# Patient Record
Sex: Male | Born: 1950 | Race: White | Hispanic: No | Smoking: Former smoker
Health system: Southern US, Community
[De-identification: ages and names within clinical notes are randomized; demographics above are authoritative.]

## PROBLEM LIST (undated history)

## (undated) DIAGNOSIS — E119 Type 2 diabetes mellitus without complications: Secondary | ICD-10-CM

## (undated) DIAGNOSIS — K649 Unspecified hemorrhoids: Secondary | ICD-10-CM

## (undated) DIAGNOSIS — K219 Gastro-esophageal reflux disease without esophagitis: Secondary | ICD-10-CM

## (undated) DIAGNOSIS — A879 Viral meningitis, unspecified: Secondary | ICD-10-CM

## (undated) DIAGNOSIS — I341 Nonrheumatic mitral (valve) prolapse: Secondary | ICD-10-CM

## (undated) HISTORY — DX: Unspecified hemorrhoids: K64.9

## (undated) HISTORY — DX: Nonrheumatic mitral (valve) prolapse: I34.1

---

## 1998-01-17 ENCOUNTER — Ambulatory Visit (HOSPITAL_COMMUNITY): Admission: RE | Admit: 1998-01-17 | Discharge: 1998-01-17 | Payer: Self-pay | Admitting: Gastroenterology

## 1998-12-03 ENCOUNTER — Ambulatory Visit (HOSPITAL_BASED_OUTPATIENT_CLINIC_OR_DEPARTMENT_OTHER): Admission: RE | Admit: 1998-12-03 | Discharge: 1998-12-03 | Payer: Self-pay | Admitting: Surgery

## 1999-05-25 DIAGNOSIS — A879 Viral meningitis, unspecified: Secondary | ICD-10-CM

## 1999-05-25 HISTORY — DX: Viral meningitis, unspecified: A87.9

## 1999-05-25 HISTORY — PX: LIPOMA EXCISION: SHX5283

## 2000-03-23 ENCOUNTER — Emergency Department (HOSPITAL_COMMUNITY): Admission: EM | Admit: 2000-03-23 | Discharge: 2000-03-23 | Payer: Self-pay | Admitting: Emergency Medicine

## 2000-03-24 ENCOUNTER — Inpatient Hospital Stay (HOSPITAL_COMMUNITY): Admission: EM | Admit: 2000-03-24 | Discharge: 2000-03-28 | Payer: Self-pay | Admitting: Emergency Medicine

## 2000-03-24 ENCOUNTER — Encounter: Payer: Self-pay | Admitting: Emergency Medicine

## 2005-12-05 ENCOUNTER — Emergency Department (HOSPITAL_COMMUNITY): Admission: EM | Admit: 2005-12-05 | Discharge: 2005-12-06 | Payer: Self-pay | Admitting: Emergency Medicine

## 2013-08-09 ENCOUNTER — Encounter: Payer: Self-pay | Admitting: Interventional Cardiology

## 2013-09-25 ENCOUNTER — Ambulatory Visit: Payer: Self-pay | Admitting: Interventional Cardiology

## 2013-10-26 ENCOUNTER — Encounter: Payer: Self-pay | Admitting: Interventional Cardiology

## 2013-10-26 ENCOUNTER — Encounter: Payer: Self-pay | Admitting: Cardiology

## 2013-10-26 ENCOUNTER — Ambulatory Visit (INDEPENDENT_AMBULATORY_CARE_PROVIDER_SITE_OTHER): Payer: BC Managed Care – PPO | Admitting: Interventional Cardiology

## 2013-10-26 ENCOUNTER — Encounter (INDEPENDENT_AMBULATORY_CARE_PROVIDER_SITE_OTHER): Payer: Self-pay

## 2013-10-26 VITALS — BP 149/70 | Ht 71.0 in | Wt 189.0 lb

## 2013-10-26 DIAGNOSIS — IMO0001 Reserved for inherently not codable concepts without codable children: Secondary | ICD-10-CM

## 2013-10-26 DIAGNOSIS — I059 Rheumatic mitral valve disease, unspecified: Secondary | ICD-10-CM

## 2013-10-26 DIAGNOSIS — I341 Nonrheumatic mitral (valve) prolapse: Secondary | ICD-10-CM

## 2013-10-26 DIAGNOSIS — E1165 Type 2 diabetes mellitus with hyperglycemia: Secondary | ICD-10-CM | POA: Insufficient documentation

## 2013-10-26 NOTE — Progress Notes (Signed)
Patient ID: Matthew Lozano, male   DOB: May 09, 1951, 63 y.o.   MRN: [id number]    [address] [place], [place]  [address] Phone: [phone] Fax:  [phone]  Date:  10/26/2013   ID:  Matthew Lozano, DOB [birth year], MRN [id number]  PCP:  No primary provider on file.      History of Present Illness: Matthew Lozano is a 63 y.o. male who had MVP diagnosed 30 years ago. He had an echo. He has had a few cardiolites in the past. The last was 6-7 years ago. THey were normal.   No SHOB. No chest pain, even with walking up hills.   No sweating. Nausa better after stopping metformin. No lightheadedness or syncope. No other exercise other than golf. He wants to get into more exercise. Feet limits exercise.  Walks a lot at work.    Wt Readings from Last 3 Encounters:  10/26/13 189 lb (85.73 kg)     Past Medical History  Diagnosis Date  . Diabetes mellitus without complication     2008  . MVP (mitral valve prolapse)   . Hemorrhoids     Current Outpatient Prescriptions  Medication Sig Dispense Refill  . INVOKANA 100 MG TABS Take 100 mg by mouth daily.        No current facility-administered medications for this visit.    Allergies:    Allergies  Allergen Reactions  . Codeine     GI upset  . Phenergan [Promethazine Hcl]     Diarrhea, constipation     Social History:  The patient  reports that he has quit smoking. He does not have any smokeless tobacco history on file. He reports that he drinks alcohol. He reports that he does not use illicit drugs.   Family History:  The patient's family history includes CVA in his father; Other in his brother.   ROS:  Please see the history of present illness.  No nausea, vomiting.  No fevers, chills.  No focal weakness.  No dysuria. Foot oain.    All other systems reviewed and negative.   PHYSICAL EXAM: VS:  BP 149/70  Ht 5\' 11"  (1.803 m)  Wt 189 lb (85.73 kg)  BMI 26.37 kg/m2 Well nourished, well developed, in no acute  distress HEENT: normal Neck: no JVD, no carotid bruits Cardiac:  normal S1, S2; RRR;  Lungs:  clear to auscultation bilaterally, no wheezing, rhonchi or rales Abd: soft, nontender, no hepatomegaly Ext: no edema Skin: warm and dry Neuro:   no focal abnormalities noted  EKG: normal    ASSESSMENT AND PLAN:  1. Chest pain: Resolved. He had negative stress test last year. No further symptoms. Continue aggressive preventive therapy. 2. MVP: Not noted on most recent echo, 2014. 3. F/u prn.  Preventive Medicine  Adult topics discussed:  Diet: healthy diet, low calorie, low fat.  Exercise: 5 days a week, at least 30 minutes of aerobic exercise.      Signed, Fredric Mare, MD, [facility] 10/26/2013 2:01 PM

## 2013-10-26 NOTE — Patient Instructions (Signed)
Your physician recommends that you schedule a follow-up appointment as needed.   Call us if your BP is consistently above 130/80.

## 2014-08-29 DIAGNOSIS — M799 Soft tissue disorder, unspecified: Secondary | ICD-10-CM | POA: Insufficient documentation

## 2014-08-29 DIAGNOSIS — K625 Hemorrhage of anus and rectum: Secondary | ICD-10-CM | POA: Insufficient documentation

## 2015-01-15 ENCOUNTER — Emergency Department (HOSPITAL_BASED_OUTPATIENT_CLINIC_OR_DEPARTMENT_OTHER): Payer: BLUE CROSS/BLUE SHIELD

## 2015-01-15 ENCOUNTER — Emergency Department (HOSPITAL_BASED_OUTPATIENT_CLINIC_OR_DEPARTMENT_OTHER)
Admission: EM | Admit: 2015-01-15 | Discharge: 2015-01-15 | Disposition: A | Discharge status: Home / Self Care | Payer: BLUE CROSS/BLUE SHIELD | Attending: Emergency Medicine | Admitting: Emergency Medicine

## 2015-01-15 ENCOUNTER — Encounter (HOSPITAL_BASED_OUTPATIENT_CLINIC_OR_DEPARTMENT_OTHER): Payer: Self-pay | Admitting: Emergency Medicine

## 2015-01-15 DIAGNOSIS — Z8679 Personal history of other diseases of the circulatory system: Secondary | ICD-10-CM | POA: Insufficient documentation

## 2015-01-15 DIAGNOSIS — E119 Type 2 diabetes mellitus without complications: Secondary | ICD-10-CM | POA: Diagnosis not present

## 2015-01-15 DIAGNOSIS — R1084 Generalized abdominal pain: Secondary | ICD-10-CM | POA: Diagnosis present

## 2015-01-15 DIAGNOSIS — R1011 Right upper quadrant pain: Secondary | ICD-10-CM | POA: Diagnosis not present

## 2015-01-15 DIAGNOSIS — Z8719 Personal history of other diseases of the digestive system: Secondary | ICD-10-CM | POA: Insufficient documentation

## 2015-01-15 DIAGNOSIS — Z79899 Other long term (current) drug therapy: Secondary | ICD-10-CM | POA: Diagnosis not present

## 2015-01-15 DIAGNOSIS — Z87891 Personal history of nicotine dependence: Secondary | ICD-10-CM | POA: Insufficient documentation

## 2015-01-15 DIAGNOSIS — R110 Nausea: Secondary | ICD-10-CM | POA: Diagnosis not present

## 2015-01-15 LAB — CBC WITH DIFFERENTIAL/PLATELET
Basophils Absolute: 0.1 10*3/uL (ref 0.0–0.1)
Basophils Relative: 1 % (ref 0–1)
Eosinophils Absolute: 0.1 10*3/uL (ref 0.0–0.7)
Eosinophils Relative: 1 % (ref 0–5)
HCT: 43.7 % (ref 39.0–52.0)
Hemoglobin: 15.9 g/dL (ref 13.0–17.0)
Lymphocytes Relative: 35 % (ref 12–46)
Lymphs Abs: 2.7 10*3/uL (ref 0.7–4.0)
MCH: 32.4 pg (ref 26.0–34.0)
MCHC: 36.4 g/dL — ABNORMAL HIGH (ref 30.0–36.0)
MCV: 89.2 fL (ref 78.0–100.0)
Monocytes Absolute: 0.7 10*3/uL (ref 0.1–1.0)
Monocytes Relative: 9 % (ref 3–12)
Neutro Abs: 4.2 10*3/uL (ref 1.7–7.7)
Neutrophils Relative %: 54 % (ref 43–77)
Platelets: 188 10*3/uL (ref 150–400)
RBC: 4.9 MIL/uL (ref 4.22–5.81)
RDW: 12.1 % (ref 11.5–15.5)
WBC: 7.8 10*3/uL (ref 4.0–10.5)

## 2015-01-15 LAB — COMPREHENSIVE METABOLIC PANEL
ALT: 18 U/L (ref 17–63)
AST: 19 U/L (ref 15–41)
Albumin: 4.4 g/dL (ref 3.5–5.0)
Alkaline Phosphatase: 75 U/L (ref 38–126)
Anion gap: 11 (ref 5–15)
BUN: 17 mg/dL (ref 6–20)
CO2: 25 mmol/L (ref 22–32)
Calcium: 9.3 mg/dL (ref 8.9–10.3)
Chloride: 103 mmol/L (ref 101–111)
Creatinine, Ser: 0.83 mg/dL (ref 0.61–1.24)
GFR calc Af Amer: >60 mL/min (ref >60)
GFR calc non Af Amer: >60 mL/min (ref >60)
Glucose, Bld: 183 mg/dL — ABNORMAL HIGH (ref 65–99)
Potassium: 3.6 mmol/L (ref 3.5–5.1)
Sodium: 139 mmol/L (ref 135–145)
Total Bilirubin: 0.9 mg/dL (ref 0.3–1.2)
Total Protein: 7.1 g/dL (ref 6.5–8.1)

## 2015-01-15 LAB — LIPASE, BLOOD: Lipase: 39 U/L (ref 22–51)

## 2015-01-15 MED ORDER — IOHEXOL 300 MG/ML  SOLN
100.0000 mL | Freq: Once | INTRAMUSCULAR | Status: AC | PRN
  Administered 2015-01-15: 100 mL via INTRAVENOUS

## 2015-01-15 MED ORDER — IOHEXOL 300 MG/ML  SOLN
25.0000 mL | Freq: Once | INTRAMUSCULAR | Status: AC | PRN
  Administered 2015-01-15: 25 mL via ORAL

## 2015-01-15 NOTE — ED Notes (Signed)
Patient transported to X-ray 

## 2015-01-15 NOTE — Discharge Instructions (Signed)

## 2015-01-15 NOTE — ED Provider Notes (Signed)
CSN: [id number]     Arrival date & time 01/15/15  1715 History  This chart was scribed for Linwood Dibbles, MD by Octavia Heir, ED Scribe. This patient was seen in room MH01/MH01 and the patient's care was started at 8:31 PM.    Chief Complaint  Patient presents with  . Abdominal Pain  . Referral      The history is provided by the patient. No language interpreter was used.   HPI Comments: Matthew Lozano is a 64 y.o. male who has hx of DM and MVP presents to the Emergency Department complaining of intermittent, gradual worsening abdominal pain onset one month ago. Pt states having some generalized discomfort and nausea in the morning. He states he hit a bag of dirt and injured his abdomen shortly after. Pt also states being seen by his PCP on Monday for some blood work and was told to come to the ED due to his pancreatitis labs being elevated. Pt denies fevers, vomiting, and loss of appetite. Pt states he is a daily drinker.   Past Medical History  Diagnosis Date  . Diabetes mellitus without complication     2008  . MVP (mitral valve prolapse)   . Hemorrhoids    Past Surgical History  Procedure Laterality Date  . Lipoma excision      OF BACK    Family History  Problem Relation Age of Onset  . CVA Father   . Other Brother     AIDS   Social History  Substance Use Topics  . Smoking status: Former Smoker -- 10 years  . Smokeless tobacco: None  . Alcohol Use: Yes    Review of Systems  Constitutional: Negative for fever and appetite change.  Gastrointestinal: Positive for nausea and abdominal pain. Negative for vomiting.  All other systems reviewed and are negative.     Allergies  Codeine and Phenergan  Home Medications   Prior to Admission medications   Medication Sig Start Date End Date Taking? Authorizing Provider  INVOKANA 100 MG TABS Take 100 mg by mouth daily.  10/24/13   Historical Provider, MD   Triage vitals: BP 138/74 mmHg  Pulse 74  Temp(Src) 98.3 F (36.8 C)  (Oral)  Resp 16  Ht 5\' 11"  (1.803 m)  Wt 181 lb (82.101 kg)  BMI 25.26 kg/m2  SpO2 99% Physical Exam  Constitutional: He appears well-developed and well-nourished. No distress.  HENT:  Head: Normocephalic and atraumatic.  Right Ear: External ear normal.  Left Ear: External ear normal.  Eyes: Conjunctivae are normal. Right eye exhibits no discharge. Left eye exhibits no discharge. No scleral icterus.  Neck: Neck supple. No tracheal deviation present.  Cardiovascular: Normal rate, regular rhythm and intact distal pulses.   Pulmonary/Chest: Effort normal and breath sounds normal. No stridor. No respiratory distress. He has no wheezes. He has no rales.  Abdominal: Soft. Bowel sounds are normal. He exhibits no distension. There is no tenderness. There is no rebound and no guarding.  Musculoskeletal: He exhibits no edema or tenderness.  Neurological: He is alert. He has normal strength. No cranial nerve deficit (no facial droop, extraocular movements intact, no slurred speech) or sensory deficit. He exhibits normal muscle tone. He displays no seizure activity. Coordination normal.  Skin: Skin is warm and dry. No rash noted.  Psychiatric: He has a normal mood and affect.  Nursing note and vitals reviewed.   ED Course  Procedures  DIAGNOSTIC STUDIES: Oxygen Saturation is 99% on RA, normal by my  interpretation.  COORDINATION OF CARE:  8:35 PM Discussed treatment plan which includes CT scan of abdomen, recheck lab work with pt at bedside and pt agreed to plan.  Labs Review Labs Reviewed  CBC WITH DIFFERENTIAL/PLATELET - Abnormal; Notable for the following:    MCHC 36.4 (*)    All other components within normal limits  COMPREHENSIVE METABOLIC PANEL - Abnormal; Notable for the following:    Glucose, Bld 183 (*)    All other components within normal limits  LIPASE, BLOOD    Imaging Review Ct Abdomen Pelvis W Contrast  01/15/2015   CLINICAL DATA:  Right abdominal pain for 1 month.   EXAM: CT ABDOMEN AND PELVIS WITH CONTRAST  TECHNIQUE: Multidetector CT imaging of the abdomen and pelvis was performed using the standard protocol following bolus administration of intravenous contrast.  CONTRAST:  25mL OMNIPAQUE IOHEXOL 300 MG/ML SOLN, OMNIPAQUE IOHEXOL 300 MG/ML SOLN  COMPARISON:  None.  FINDINGS: Lower chest: Clear lung bases. Normal heart size. Mild coronary artery atherosclerosis.  Hepatobiliary: Low attenuation of the liver as can be seen with hepatic steatosis. Normal gallbladder. No focal hepatic mass. No intrahepatic or extrahepatic biliary ductal dilatation.  Pancreas: Normal.  Spleen: Normal.  Adrenals/Urinary Tract: Normal adrenal glands. Normal kidneys. Normal bladder.  Stomach/Bowel: No bowel wall thickening. No bowel dilatation. No pneumatosis, pneumoperitoneum or portal venous gas. Normal caliber appendix in the right lower quadrant without periappendiceal inflammatory changes. Moderate amount of stool in the ascending colon. No abdominal or pelvic free fluid. Diverticulosis of the sigmoid colon without evidence of diverticulitis.  Vascular/Lymphatic: Normal caliber abdominal aorta with atherosclerosis.  Other: No fluid collection or hematoma.  Musculoskeletal: No acute osseous abnormality. No lytic or sclerotic osseous lesion. Degenerative disc disease with disc height loss at L5-S1 to with a broad-based disc osteophyte complex and bilateral facet arthropathy.  IMPRESSION: 1. No CT evidence of acute pancreatitis. 2. Normal appendix. 3. Hepatic steatosis.   Electronically Signed   By: Elige Ko   On: 01/15/2015 22:17   I have personally reviewed and evaluated these image reports and lab results as part of my medical decision-making.   MDM   Final diagnoses:  Right upper quadrant pain    No etiology for his abdominal pain based on the labs and CT scan.  Will dc home.  Follow up with his PCP for further evaluation.  I personally performed the services described in  this documentation, which was scribed in my presence.  The recorded information has been reviewed and is accurate.   Linwood Dibbles, MD 01/15/15 [phone]

## 2015-01-15 NOTE — ED Notes (Signed)
Pt seen by PCP on Monday for blood work.  Was told that labs were elevated and that he needed to go to ED.  Pt having abdominal discomfort.  Some morning nausea.  And some generalized discomfort.

## 2015-03-05 ENCOUNTER — Other Ambulatory Visit: Payer: Self-pay | Admitting: Surgery

## 2015-03-05 NOTE — H&P (Signed)
Matthew Lozano 03/05/2015 9:28 AM Location: [facility] Patient #: [id number] DOB: 11/25/1950 Divorced / Language: [place] / Race: White Male History of Present Illness Ardeth Sportsman(Dejuan Elman C. Bryann Gentz MD; 03/05/2015 9:57 AM) Patient words: reck.  The patient is a 64 year old male who presents with skin lesions. Patient sent by his primary care physician, Dr. Catha GosselinKevin Little, for concern of mass on back. Pleasant active male. Golfs regularly. History of lipoma removed off his neck decades ago without incident. No other lumps or bumps. His new girlfriend noticed a nodule just inside his LEFT shoulder blade in his upper back. Was concerned. He mentioned to his primary care physician. Surgical consultation requested. See most likely consistent with a small lipoma. I offered removal when I saw him in April 2016. I never heard from him again. He tells me that his stayed the same and does not bother him so he did not want it removed. However he points to a new nodule in his RIGHT upper abdomen. It is sensitive and bothersome. Not particularly larger. He thinks it's been there for several months. He thought he had felt something in the RIGHT lower abdomen but cannot feel it today. No history of fall or trauma. Patient denies any pain. No fevers or chills. His had mild intentional weight loss but not severe. No night sweats. No history of lymphoma or leukemia. No history of pain or discomfort. No history of fall or trauma. It has not drained. He does not think it is gotten larger. He can walk 5 miles without difficulty. Problem List/Past Medical Ardeth Sportsman(Trevonn Hallum C Yuriana Gaal, MD; 03/05/2015 9:56 AM) MASS OF SUBCUTANEOUS TISSUE OF BACK (R22.2)  Other Problems Ardeth Sportsman(Kamaya Keckler C Jakaya Jacobowitz, MD; 03/05/2015 9:56 AM) Diabetes Mellitus Heart murmur Hemorrhoids  Past Surgical History Ardeth Sportsman(Theo Krumholz C Jermika Olden, MD; 03/05/2015 9:56 AM) Colon Polyp Removal - Colonoscopy Oral Surgery  Diagnostic Studies History Ardeth Sportsman(Tiea Manninen C Maralyn Witherell,  MD; 03/05/2015 9:56 AM) Colonoscopy 5-10 years ago  Allergies Lamar Laundry(Sonya Bynum, CMA; 03/05/2015 9:29 AM) Codeine Phosphate *ANALGESICS - OPIOID* Phenergan *ANTIHISTAMINES*  Medication History (Sonya Bynum, CMA; 03/05/2015 9:29 AM) Invokana (300MG  Tablet, Oral) Active. Medications Reconciled  Social History Ardeth Sportsman(Skiler Olden C Yonah Tangeman, MD; 03/05/2015 9:56 AM) Alcohol use Moderate alcohol use. No caffeine use No drug use Tobacco use Former smoker.  Family History Ardeth Sportsman(Tanise Russman C Yonis Carreon, MD; 03/05/2015 9:56 AM) Alcohol Abuse Mother. Colon Polyps Mother.     Review of Systems Ardeth Sportsman(Daryan Cagley C Judy Pollman, MD; 03/05/2015 9:56 00) General Present- Weight Loss. Not Present- Appetite Loss, Chills, Fatigue, Fever, Night Sweats and Weight Gain. Skin Not Present- Change in Wart/Mole, Dryness, Hives, Jaundice, New Lesions, Non-Healing Wounds, Rash and Ulcer. HEENT Not Present- Earache, Hearing Loss, Hoarseness, Nose Bleed, Oral Ulcers, Ringing in the Ears, Seasonal Allergies, Sinus Pain, Sore Throat, Visual Disturbances, Wears glasses/contact lenses and Yellow Eyes. Respiratory Not Present- Bloody sputum, Chronic Cough, Difficulty Breathing, Snoring and Wheezing. Breast Not Present- Breast Mass, Breast Pain, Nipple Discharge and Skin Changes. Cardiovascular Not Present- Chest Pain, Difficulty Breathing Lying Down, Leg Cramps, Palpitations, Rapid Heart Rate, Shortness of Breath and Swelling of Extremities. Gastrointestinal Present- Bloody Stool and Hemorrhoids. Not Present- Abdominal Pain, Bloating, Change in Bowel Habits, Chronic diarrhea, Constipation, Difficulty Swallowing, Excessive gas, Gets full quickly at meals, Indigestion, Nausea, Rectal Pain and Vomiting. Male Genitourinary Not Present- Blood in Urine, Change in Urinary Stream, Frequency, Impotence, Nocturia, Painful Urination, Urgency and Urine Leakage. Musculoskeletal Not Present- Back Pain, Joint Pain, Joint Stiffness, Muscle Pain, Muscle Weakness and  Swelling of Extremities. Neurological Not Present-  Decreased Memory, Fainting, Headaches, Numbness, Seizures, Tingling, Tremor, Trouble walking and Weakness. Psychiatric Not Present- Anxiety, Bipolar, Change in Sleep Pattern, Depression, Fearful and Frequent crying. Endocrine Not Present- Cold Intolerance, Excessive Hunger, Hair Changes, Heat Intolerance and New Diabetes. Hematology Not Present- Easy Bruising, Excessive bleeding, Gland problems, HIV and Persistent Infections.  Vitals (Sonya Bynum CMA; 03/05/2015 9:28 AM) 03/05/2015 9:28 AM Weight: 186 lb Height: 71in Body Surface Area: 2.06 m Body Mass Index: 25.94 kg/m Temp.: 9F(Temporal)  Pulse: 73 (Regular)  BP: 126/74 (Sitting, Left Arm, Standard)     Physical Exam Ardeth Sportsman MD; 03/05/2015 9:58 AM)  General Mental Status-Alert. General Appearance-Not in acute distress, Not Sickly. Orientation-Oriented X3. Hydration-Well hydrated. Voice-Normal.  Integumentary Global Assessment Normal Exam - Axillae: non-tender, no inflammation or ulceration, no drainage. and Distribution of scalp and body hair is normal. General Characteristics Temperature - normal warmth is noted.  Head and Neck Head-normocephalic, atraumatic with no lesions or palpable masses. Face Global Assessment - atraumatic, no absence of expression. Neck Global Assessment - no abnormal movements, no bruit auscultated on the right, no bruit auscultated on the left, no decreased range of motion, non-tender. Trachea-midline. Thyroid Gland Characteristics - non-tender.  Eye Eyeball - Left-Extraocular movements intact, No Nystagmus. Eyeball - Right-Extraocular movements intact, No Nystagmus. Cornea - Left-No Hazy. Cornea - Right-No Hazy. Sclera/Conjunctiva - Left-No scleral icterus, No Discharge. Sclera/Conjunctiva - Right-No scleral icterus, No Discharge. Pupil - Left-Direct reaction to light normal. Pupil -  Right-Direct reaction to light normal.  ENMT Ears Pinna - Left - no drainage observed, no generalized tenderness observed. Right - no drainage observed, no generalized tenderness observed. Nose and Sinuses Nose - no destructive lesion observed. Nares - Left - quiet respiration. Right - quiet respiration. Mouth and Throat Lips - Upper Lip - no fissures observed, no pallor noted. Lower Lip - no fissures observed, no pallor noted. Nasopharynx - no discharge present. Oral Cavity/Oropharynx - Tongue - no dryness observed. Oral Mucosa - no cyanosis observed. Hypopharynx - no evidence of airway distress observed.  Chest and Lung Exam Inspection Movements - Normal and Symmetrical. Accessory muscles - No use of accessory muscles in breathing. Palpation Normal exam - Non-tender. Auscultation Breath sounds - Normal and Clear.  Cardiovascular Auscultation Rhythm - Regular. Murmurs & Other Heart Sounds - Normal exam - No Murmurs and No Systolic Clicks.  Abdomen Inspection Normal Exam - No Visible peristalsis and No Abnormal pulsations. Umbilicus - No Bleeding, No Urine drainage. Palpation/Percussion Normal exam - Soft, Non Tender, No Rebound tenderness, No Rigidity (guarding) and No Cutaneous hyperesthesia. Note: Soft and flat. 1 cm deep ellipsoid subcutaneous nodule a few centimeters inferior to the subcostal ridge in the midclavicular line. Fluctuance. No cellulitis. Mostly fixed.   Male Genitourinary Sexual Maturity Tanner 5 - Adult hair pattern and Adult penile size and shape.  Peripheral Vascular Upper Extremity Inspection - Left - No Cyanotic nailbeds, Not Ischemic. Right - No Cyanotic nailbeds, Not Ischemic.  Neurologic Neurologic evaluation reveals -normal attention span and ability to concentrate, able to name objects and repeat phrases. Appropriate fund of knowledge , normal sensation and normal coordination. Mental Status Affect - not angry, not paranoid. Cranial  Nerves-Normal Bilaterally. Gait-Normal.  Neuropsychiatric Mental status exam performed with findings of-able to articulate well with normal speech/language, rate, volume and coherence, thought content normal with ability to perform basic computations and apply abstract reasoning and no evidence of hallucinations, delusions, obsessions or homicidal/suicidal ideation.  Musculoskeletal Global Assessment Spine, Ribs and Pelvis -  no instability, subluxation or laxity. Right Upper Extremity - no instability, subluxation or laxity. Note: Smooth but firm and somewhat mobile deep 2cm subcutaneous nodule on back. Mid thoracic on LEFT side, just medial to the lower scapula. Not quite at the level of the tip. Does not hurt. No fluctuance. No drainage. A few centimeters deep to the skin. No change in size from evaluation April 2016   Lymphatic Head & Neck General Head & Neck Lymphatics: Bilateral - Description - No Localized lymphadenopathy. Axillary General Axillary Region: Bilateral - Description - No Localized lymphadenopathy. Femoral & Inguinal Generalized Femoral & Inguinal Lymphatics: Left: Right - Description - No Localized lymphadenopathy. Description - No Localized lymphadenopathy.    Assessment & Plan Ardeth Sportsman MD; 03/05/2015 10:01 AM)  MASS OF SUBCUTANEOUS TISSUE OF BACK (R22.2) Impression: 2 cm ellipsoid mass in the deep subcutaneous tissues just inside his LEFT scapula. Mostly mobile. Most likely benign such as an enlarged lymph node or lipoma. Seems to deep to be a cyst.  Most aggressive option is removal. Given its deeper location, would recommend outpatient surgery with at least sedation if not anesthesia.  Another option is observation. He would still just like to watch it for now. If it is becoming symptomatic or gets larger, he would like to have it removed. If it does not change in size and does not bother him, he would like to hold off on any removal. I think  that is reasonable.  Current Plans Follow up if no improvement or if symptoms worsen Discussed regular exercise with patient. Pt Education - [facility] Free Text Education/Instructions ABDOMINAL WALL MASS OF RIGHT UPPER QUADRANT (R19.01) Impression: Smaller but more fixed and sensitive mass of subcutaneous tissues of abdominal wall. Rather deep. Most likely a lipoma or a lymph node. While he has no concerns or desire to remove the mass on his LEFT back, he is more concerned about the smaller more sensitive mass. He wishes to have it removed.  Given his deeper location most likely fixed to the anterior fascia, and would like to do under sedation with outpatient surgery. He agrees. He did not seen interested in May removing the other mass while I was there at this time. However, it would not surprise me if he changes his mind on the day of surgery. We will see.  Current Plans You are being scheduled for surgery - Our schedulers will call you.  You should hear from our office's scheduling department within 5 working days about the location, date, and time of surgery. We try to make accommodations for patient's preferences in scheduling surgery, but sometimes the OR schedule or the surgeon's schedule prevents [place] from making those accommodations.  If you have not heard from our office [phone]) in 5 working days, call the office and ask for your surgeon's nurse.  If you have other questions about your diagnosis, plan, or surgery, call the office and ask for your surgeon's nurse. The pathophysiology of skin & subcutaneous masses was discussed. Natural history risks without surgery were discussed. I recommended surgery to remove the mass. I explained the technique of removal with use of local anesthesia & possible need for more aggressive sedation/anesthesia for patient comfort.  Risks such as bleeding, infection, wound breakdown, heart attack, death, and other risks were discussed. I noted a good  likelihood this will help address the problem. Possibility that this will not correct all symptoms was explained. Possibility of regrowth/recurrence of the mass was discussed. We will work to  minimize complications. Questions were answered. The patient expresses understanding & wishes to proceed with surgery. Pt Education - [facility] General Post-op HCI  Ardeth Sportsman, M.D., F.A.C.S. Gastrointestinal and Minimally Invasive Surgery [facility] [address], [address] [place], [place] [address] [phone] Main / Paging

## 2015-10-14 DIAGNOSIS — H2513 Age-related nuclear cataract, bilateral: Secondary | ICD-10-CM | POA: Diagnosis not present

## 2015-10-14 DIAGNOSIS — H11152 Pinguecula, left eye: Secondary | ICD-10-CM | POA: Diagnosis not present

## 2015-10-14 DIAGNOSIS — H539 Unspecified visual disturbance: Secondary | ICD-10-CM | POA: Diagnosis not present

## 2015-10-14 DIAGNOSIS — E119 Type 2 diabetes mellitus without complications: Secondary | ICD-10-CM | POA: Diagnosis not present

## 2015-10-14 DIAGNOSIS — H11001 Unspecified pterygium of right eye: Secondary | ICD-10-CM | POA: Diagnosis not present

## 2015-10-14 DIAGNOSIS — G43009 Migraine without aura, not intractable, without status migrainosus: Secondary | ICD-10-CM | POA: Diagnosis not present

## 2015-10-15 DIAGNOSIS — E119 Type 2 diabetes mellitus without complications: Secondary | ICD-10-CM | POA: Diagnosis not present

## 2015-11-10 DIAGNOSIS — G43009 Migraine without aura, not intractable, without status migrainosus: Secondary | ICD-10-CM | POA: Diagnosis not present

## 2015-11-10 DIAGNOSIS — H11152 Pinguecula, left eye: Secondary | ICD-10-CM | POA: Diagnosis not present

## 2015-11-10 DIAGNOSIS — H2513 Age-related nuclear cataract, bilateral: Secondary | ICD-10-CM | POA: Diagnosis not present

## 2015-11-10 DIAGNOSIS — H5340 Unspecified visual field defects: Secondary | ICD-10-CM | POA: Diagnosis not present

## 2015-11-10 DIAGNOSIS — E119 Type 2 diabetes mellitus without complications: Secondary | ICD-10-CM | POA: Diagnosis not present

## 2015-11-10 DIAGNOSIS — H11001 Unspecified pterygium of right eye: Secondary | ICD-10-CM | POA: Diagnosis not present

## 2015-11-15 DIAGNOSIS — H5340 Unspecified visual field defects: Secondary | ICD-10-CM | POA: Diagnosis not present

## 2015-12-10 DIAGNOSIS — E119 Type 2 diabetes mellitus without complications: Secondary | ICD-10-CM | POA: Diagnosis not present

## 2016-03-17 DIAGNOSIS — M25511 Pain in right shoulder: Secondary | ICD-10-CM | POA: Diagnosis not present

## 2016-03-17 DIAGNOSIS — Z7984 Long term (current) use of oral hypoglycemic drugs: Secondary | ICD-10-CM | POA: Diagnosis not present

## 2016-03-17 DIAGNOSIS — Z125 Encounter for screening for malignant neoplasm of prostate: Secondary | ICD-10-CM | POA: Diagnosis not present

## 2016-03-17 DIAGNOSIS — Z79899 Other long term (current) drug therapy: Secondary | ICD-10-CM | POA: Diagnosis not present

## 2016-03-17 DIAGNOSIS — E119 Type 2 diabetes mellitus without complications: Secondary | ICD-10-CM | POA: Diagnosis not present

## 2016-03-18 DIAGNOSIS — E119 Type 2 diabetes mellitus without complications: Secondary | ICD-10-CM | POA: Diagnosis not present

## 2016-03-24 DIAGNOSIS — M25511 Pain in right shoulder: Secondary | ICD-10-CM | POA: Diagnosis not present

## 2016-03-24 DIAGNOSIS — M7541 Impingement syndrome of right shoulder: Secondary | ICD-10-CM | POA: Diagnosis not present

## 2016-03-25 DIAGNOSIS — H53432 Sector or arcuate defects, left eye: Secondary | ICD-10-CM | POA: Diagnosis not present

## 2016-04-26 DIAGNOSIS — H53432 Sector or arcuate defects, left eye: Secondary | ICD-10-CM | POA: Diagnosis not present

## 2016-07-01 ENCOUNTER — Emergency Department (HOSPITAL_COMMUNITY): Payer: Medicare Other

## 2016-07-01 ENCOUNTER — Encounter (HOSPITAL_COMMUNITY): Payer: Self-pay

## 2016-07-01 ENCOUNTER — Emergency Department (HOSPITAL_COMMUNITY)
Admission: EM | Admit: 2016-07-01 | Discharge: 2016-07-01 | Disposition: A | Discharge status: Home / Self Care | Payer: Medicare Other | Attending: Emergency Medicine | Admitting: Emergency Medicine

## 2016-07-01 DIAGNOSIS — E860 Dehydration: Secondary | ICD-10-CM | POA: Diagnosis not present

## 2016-07-01 DIAGNOSIS — J101 Influenza due to other identified influenza virus with other respiratory manifestations: Secondary | ICD-10-CM

## 2016-07-01 DIAGNOSIS — R1110 Vomiting, unspecified: Secondary | ICD-10-CM | POA: Diagnosis not present

## 2016-07-01 DIAGNOSIS — Z87891 Personal history of nicotine dependence: Secondary | ICD-10-CM | POA: Diagnosis not present

## 2016-07-01 DIAGNOSIS — E119 Type 2 diabetes mellitus without complications: Secondary | ICD-10-CM | POA: Diagnosis not present

## 2016-07-01 DIAGNOSIS — J09X9 Influenza due to identified novel influenza A virus with other manifestations: Secondary | ICD-10-CM | POA: Insufficient documentation

## 2016-07-01 DIAGNOSIS — R938 Abnormal findings on diagnostic imaging of other specified body structures: Secondary | ICD-10-CM | POA: Insufficient documentation

## 2016-07-01 DIAGNOSIS — R112 Nausea with vomiting, unspecified: Secondary | ICD-10-CM | POA: Diagnosis present

## 2016-07-01 DIAGNOSIS — J09X2 Influenza due to identified novel influenza A virus with other respiratory manifestations: Secondary | ICD-10-CM | POA: Diagnosis not present

## 2016-07-01 LAB — COMPREHENSIVE METABOLIC PANEL
ALT: 25 U/L (ref 17–63)
AST: 27 U/L (ref 15–41)
Albumin: 4.2 g/dL (ref 3.5–5.0)
Alkaline Phosphatase: 56 U/L (ref 38–126)
Anion gap: 19 — ABNORMAL HIGH (ref 5–15)
BUN: 23 mg/dL — ABNORMAL HIGH (ref 6–20)
CO2: 14 mmol/L — ABNORMAL LOW (ref 22–32)
Calcium: 8.6 mg/dL — ABNORMAL LOW (ref 8.9–10.3)
Chloride: 100 mmol/L — ABNORMAL LOW (ref 101–111)
Creatinine, Ser: 1.03 mg/dL (ref 0.61–1.24)
GFR calc Af Amer: >60 mL/min (ref >60)
GFR calc non Af Amer: >60 mL/min (ref >60)
Glucose, Bld: 210 mg/dL — ABNORMAL HIGH (ref 65–99)
Potassium: 4.1 mmol/L (ref 3.5–5.1)
Sodium: 133 mmol/L — ABNORMAL LOW (ref 135–145)
Total Bilirubin: 2 mg/dL — ABNORMAL HIGH (ref 0.3–1.2)
Total Protein: 7.2 g/dL (ref 6.5–8.1)

## 2016-07-01 LAB — BASIC METABOLIC PANEL
Anion gap: 11 (ref 5–15)
BUN: 18 mg/dL (ref 6–20)
CO2: 16 mmol/L — ABNORMAL LOW (ref 22–32)
Calcium: 7.7 mg/dL — ABNORMAL LOW (ref 8.9–10.3)
Chloride: 107 mmol/L (ref 101–111)
Creatinine, Ser: 0.85 mg/dL (ref 0.61–1.24)
GFR calc Af Amer: >60 mL/min (ref >60)
GFR calc non Af Amer: >60 mL/min (ref >60)
Glucose, Bld: 175 mg/dL — ABNORMAL HIGH (ref 65–99)
Potassium: 3.8 mmol/L (ref 3.5–5.1)
Sodium: 134 mmol/L — ABNORMAL LOW (ref 135–145)

## 2016-07-01 LAB — URINALYSIS, ROUTINE W REFLEX MICROSCOPIC
Bacteria, UA: NONE SEEN
Bilirubin Urine: NEGATIVE
Glucose, UA: >=500 mg/dL — AB
Hgb urine dipstick: NEGATIVE
Ketones, ur: 80 mg/dL — AB
Leukocytes, UA: NEGATIVE
Nitrite: NEGATIVE
Protein, ur: 30 mg/dL — AB
Specific Gravity, Urine: 1.029 (ref 1.005–1.030)
pH: 5 (ref 5.0–8.0)

## 2016-07-01 LAB — CBC WITH DIFFERENTIAL/PLATELET
Basophils Absolute: 0 10*3/uL (ref 0.0–0.1)
Basophils Relative: 0 %
Eosinophils Absolute: 0 10*3/uL (ref 0.0–0.7)
Eosinophils Relative: 0 %
HCT: 44.7 % (ref 39.0–52.0)
Hemoglobin: 17 g/dL (ref 13.0–17.0)
Lymphocytes Relative: 13 %
Lymphs Abs: 0.8 10*3/uL (ref 0.7–4.0)
MCH: 33 pg (ref 26.0–34.0)
MCHC: 38 g/dL — ABNORMAL HIGH (ref 30.0–36.0)
MCV: 86.8 fL (ref 78.0–100.0)
Monocytes Absolute: 0.7 10*3/uL (ref 0.1–1.0)
Monocytes Relative: 12 %
Neutro Abs: 4.3 10*3/uL (ref 1.7–7.7)
Neutrophils Relative %: 75 %
Platelets: 131 10*3/uL — ABNORMAL LOW (ref 150–400)
RBC: 5.15 MIL/uL (ref 4.22–5.81)
RDW: 12.2 % (ref 11.5–15.5)
WBC: 5.8 10*3/uL (ref 4.0–10.5)

## 2016-07-01 LAB — INFLUENZA PANEL BY PCR (TYPE A & B)
Influenza A By PCR: POSITIVE — AB
Influenza B By PCR: NEGATIVE

## 2016-07-01 LAB — LIPASE, BLOOD: Lipase: 22 U/L (ref 11–51)

## 2016-07-01 MED ORDER — SODIUM CHLORIDE 0.9 % IV BOLUS (SEPSIS)
2000.0000 mL | Freq: Once | INTRAVENOUS | Status: AC
  Administered 2016-07-01: 2000 mL via INTRAVENOUS

## 2016-07-01 MED ORDER — GLYCOPYRROLATE 0.2 MG/ML IJ SOLN
0.1000 mg | Freq: Once | INTRAMUSCULAR | Status: AC
  Administered 2016-07-01: 0.1 mg via INTRAVENOUS
  Filled 2016-07-01: qty 1

## 2016-07-01 MED ORDER — FAMOTIDINE 20 MG PO TABS: 20.0000 mg | ORAL_TABLET | Freq: Two times a day (BID) | ORAL | 0 refills | Status: DC

## 2016-07-01 MED ORDER — SODIUM CHLORIDE 0.9 % IJ SOLN
INTRAMUSCULAR | Status: DC
Start: 2016-07-01 — End: 2016-07-01
  Filled 2016-07-01: qty 50

## 2016-07-01 MED ORDER — IOPAMIDOL (ISOVUE-300) INJECTION 61%
INTRAVENOUS | Status: AC
  Filled 2016-07-01: qty 100

## 2016-07-01 MED ORDER — IOPAMIDOL (ISOVUE-300) INJECTION 61%
100.0000 mL | Freq: Once | INTRAVENOUS | Status: AC | PRN
  Administered 2016-07-01: 100 mL via INTRAVENOUS

## 2016-07-01 MED ORDER — DICYCLOMINE HCL 10 MG PO CAPS: 20.0000 mg | ORAL_CAPSULE | Freq: Four times a day (QID) | ORAL | 0 refills | Status: DC | PRN

## 2016-07-01 MED ORDER — GI COCKTAIL ~~LOC~~
30.0000 mL | Freq: Once | ORAL | Status: AC
  Administered 2016-07-01: 30 mL via ORAL
  Filled 2016-07-01: qty 30

## 2016-07-01 MED ORDER — ONDANSETRON HCL 4 MG PO TABS: 4.0000 mg | ORAL_TABLET | Freq: Three times a day (TID) | ORAL | 0 refills | Status: DC | PRN

## 2016-07-01 MED ORDER — FAMOTIDINE IN NACL 20-0.9 MG/50ML-% IV SOLN
20.0000 mg | Freq: Once | INTRAVENOUS | Status: AC
  Administered 2016-07-01: 20 mg via INTRAVENOUS
  Filled 2016-07-01: qty 50

## 2016-07-01 MED ORDER — LIDOCAINE VISCOUS 2 % MT SOLN: 15.0000 mL | OROMUCOSAL | 0 refills | Status: DC | PRN

## 2016-07-01 MED ORDER — SUCRALFATE 1 GM/10ML PO SUSP
1.0000 g | Freq: Once | ORAL | Status: AC
  Administered 2016-07-01: 1 g via ORAL
  Filled 2016-07-01: qty 10

## 2016-07-01 NOTE — ED Notes (Signed)
Bed: [id number] Expected date:  Expected time:  Means of arrival:  Comments: EMS 66 yo male N/V/D since Sunday-IVF's, Zofran 148/84

## 2016-07-01 NOTE — ED Notes (Signed)
Patient transported to CT 

## 2016-07-01 NOTE — Discharge Instructions (Signed)
Please follow with your primary care doctor in the next 2 days for a check-up. They must obtain records for further management.  ° °Do not hesitate to return to the Emergency Department for any new, worsening or concerning symptoms.  ° °

## 2016-07-01 NOTE — ED Provider Notes (Signed)
[facility]-EMERGENCY DEPT Provider Note   CSN: [id number] Arrival date & time: 07/01/16  4098     History   Chief Complaint Chief Complaint  Patient presents with  . Emesis    HPI   Blood pressure 133/70, pulse 63, temperature 99.3 F (37.4 C), temperature source Rectal, resp. rate 20, height 5\' 11"  (1.803 m), weight 81.6 kg, SpO2 100 %.  Matthew Lozano is a 66 y.o. male past medical history significant for non-insulin-dependent diabetes complaining of nonbloody, nonbilious, no coffee-ground emesis intermittently starting 4 days ago and lasting for 2 days with associated looser than normal stool with no melena, hematochezia. States that he's had resolution of the nausea vomiting diarrhea over 2 days but he's not been eating and drinking. He denies fever. He states that the stool has gotten a little bit darker after he started taking Pepto-Bismol. He is not anticoagulated. He has a left upper quadrant pain which she rates as severe onset before the emesis. He has no associated chest pain, shortness of breath. Is been putting out less urine than normal. He states that his roommate is getting sick with the flu but that he has no sick contacts that her vomiting. He is reporting some body aches with no cough, congestion, headache.   Past Medical History:  Diagnosis Date  . Diabetes mellitus without complication (HCC)    2008  . Hemorrhoids   . MVP (mitral valve prolapse)     Patient Active Problem List   Diagnosis Date Noted  . Type II or unspecified type diabetes mellitus without mention of complication, uncontrolled 10/26/2013  . MVP (mitral valve prolapse)     Past Surgical History:  Procedure Laterality Date  . LIPOMA EXCISION     OF BACK        Home Medications    Prior to Admission medications   Medication Sig Start Date End Date Taking? Authorizing Provider  bismuth subsalicylate (PEPTO BISMOL) 262 MG/15ML suspension Take 30 mLs by mouth every 6 (six) hours as needed for  indigestion.   Yes Historical Provider, MD  calcium carbonate (TUMS - DOSED IN MG ELEMENTAL CALCIUM) 500 MG chewable tablet Chew 2 tablets by mouth as needed for indigestion or heartburn.   Yes Historical Provider, MD  canagliflozin (INVOKANA) 300 MG TABS tablet Take 300 mg by mouth daily before breakfast.   Yes Historical Provider, MD  dicyclomine (BENTYL) 10 MG capsule Take 2 capsules (20 mg total) by mouth 4 (four) times daily as needed for spasms. 07/01/16   Linkyn Gobin, PA-C  famotidine (PEPCID) 20 MG tablet Take 1 tablet (20 mg total) by mouth 2 (two) times daily. 07/01/16   Kadie Balestrieri, PA-C  lidocaine (XYLOCAINE) 2 % solution Use as directed 15 mLs in the mouth or throat every 3 (three) hours as needed. 07/01/16   Lonnie Reth, PA-C  ondansetron (ZOFRAN) 4 MG tablet Take 1 tablet (4 mg total) by mouth every 8 (eight) hours as needed for nausea or vomiting. 07/01/16   Joni Reining Shamar Kracke, PA-C    Family History Family History  Problem Relation Age of Onset  . CVA Father   . Other Brother     AIDS    Social History Social History  Substance Use Topics  . Smoking status: Former Smoker    Years: 10.00  . Smokeless tobacco: Not on file  . Alcohol use Yes     Allergies   Codeine; Phenergan [promethazine hcl]; and Androgel [testosterone]   Review of Systems Review of Systems  10 systems reviewed and found to be negative, except as noted in the HPI.  Physical Exam Updated Vital Signs BP 128/68   Pulse 78   Temp 99.3 F (37.4 C) (Rectal)   Resp 18   Ht 5\' 11"  (1.803 m)   Wt 81.6 kg   SpO2 99%   BMI 25.10 kg/m   Physical Exam  Constitutional: He is oriented to person, place, and time. He appears well-developed and well-nourished. No distress.  HENT:  Head: Normocephalic and atraumatic.  Dry mucous membranes  Eyes: Conjunctivae and EOM are normal. Pupils are equal, round, and reactive to light.  Neck: Normal range of motion.  Cardiovascular: Normal rate,  regular rhythm and intact distal pulses.   Pulmonary/Chest: Effort normal and breath sounds normal.  Abdominal: Soft. There is no tenderness.  Musculoskeletal: Normal range of motion.  Neurological: He is alert and oriented to person, place, and time.  Skin: He is not diaphoretic.  Psychiatric: He has a normal mood and affect.  Nursing note and vitals reviewed.    ED Treatments / Results  Labs (all labs ordered are listed, but only abnormal results are displayed) Labs Reviewed  CBC WITH DIFFERENTIAL/PLATELET - Abnormal; Notable for the following:       Result Value   MCHC 38.0 (*)    Platelets 131 (*)    All other components within normal limits  COMPREHENSIVE METABOLIC PANEL - Abnormal; Notable for the following:    Sodium 133 (*)    Chloride 100 (*)    CO2 14 (*)    Glucose, Bld 210 (*)    BUN 23 (*)    Calcium 8.6 (*)    Total Bilirubin 2.0 (*)    Anion gap 19 (*)    All other components within normal limits  URINALYSIS, ROUTINE W REFLEX MICROSCOPIC - Abnormal; Notable for the following:    Glucose, UA >=500 (*)    Ketones, ur 80 (*)    Protein, ur 30 (*)    Squamous Epithelial / LPF 0-5 (*)    All other components within normal limits  INFLUENZA PANEL BY PCR (TYPE A & B) - Abnormal; Notable for the following:    Influenza A By PCR POSITIVE (*)    All other components within normal limits  BASIC METABOLIC PANEL - Abnormal; Notable for the following:    Sodium 134 (*)    CO2 16 (*)    Glucose, Bld 175 (*)    Calcium 7.7 (*)    All other components within normal limits  LIPASE, BLOOD    EKG  EKG Interpretation None       Radiology Ct Abdomen Pelvis W Contrast  Result Date: 07/01/2016 CLINICAL DATA:  Nausea, vomiting, body aches and chills for 4 days. EXAM: CT ABDOMEN AND PELVIS WITH CONTRAST TECHNIQUE: Multidetector CT imaging of the abdomen and pelvis was performed using the standard protocol following bolus administration of intravenous contrast. CONTRAST:   100 ml ISOVUE-300 IOPAMIDOL (ISOVUE-300) INJECTION 61% COMPARISON:  None. FINDINGS: Lower chest: Lung bases clear.  No pleural or pericardial effusion. Hepatobiliary: The liver is low attenuating compatible with fatty infiltration. No focal lesion. Gallbladder and biliary tree appear normal Pancreas: Unremarkable. No pancreatic ductal dilatation or surrounding inflammatory changes. Spleen: Normal in size without focal abnormality. Adrenals/Urinary Tract: Adrenal glands are unremarkable. Kidneys are normal, without renal calculi, focal lesion, or hydronephrosis. Bladder is unremarkable. Stomach/Bowel: Stomach is within normal limits. Appendix appears normal. No evidence of bowel wall thickening, distention, or  inflammatory changes. Incidental note is made that the transverse colon is decompressed. Vascular/Lymphatic: Scattered aortoiliac atherosclerosis without aneurysm. No lymphadenopathy. Reproductive: Prostate is unremarkable. Other: No fluid collection.  Very small umbilical hernia noted. Musculoskeletal: No lytic or sclerotic lesion. No fracture. Degenerative disc disease L5-S1 noted. IMPRESSION: No acute abnormality abdomen or pelvis. No finding to explain the patient's symptoms. Fatty infiltration of the liver. Atherosclerosis. Electronically Signed   By: Drusilla Kanner M.D.   On: 07/01/2016 09:52    Procedures Procedures (including critical care time)  CRITICAL CARE Performed by: Wynetta Emery   Total critical care time: 35 minutes  Critical care time was exclusive of separately billable procedures and treating other patients.  Critical care was necessary to treat or prevent imminent or life-threatening deterioration.  Critical care was time spent personally by me on the following activities: development of treatment plan with patient and/or surrogate as well as nursing, discussions with consultants, evaluation of patient's response to treatment, examination of patient, obtaining history  from patient or surrogate, ordering and performing treatments and interventions, ordering and review of laboratory studies, ordering and review of radiographic studies, pulse oximetry and re-evaluation of patient's condition.   Medications Ordered in ED Medications  iopamidol (ISOVUE-300) 61 % injection (not administered)  sodium chloride 0.9 % injection (not administered)  famotidine (PEPCID) IVPB 20 mg premix (0 mg Intravenous Stopped 07/01/16 0918)  sodium chloride 0.9 % bolus 2,000 mL (0 mLs Intravenous Stopped 07/01/16 0918)  glycopyrrolate (ROBINUL) injection 0.1 mg (0.1 mg Intravenous Given 07/01/16 0717)  sodium chloride 0.9 % bolus 2,000 mL (0 mLs Intravenous Stopped 07/01/16 1025)  gi cocktail (Maalox,Lidocaine,Donnatal) (30 mLs Oral Given 07/01/16 0925)  sucralfate (CARAFATE) 1 GM/10ML suspension 1 g (1 g Oral Given 07/01/16 0925)  iopamidol (ISOVUE-300) 61 % injection 100 mL (100 mLs Intravenous Contrast Given 07/01/16 0937)     Initial Impression / Assessment and Plan / ED Course  I have reviewed the triage vital signs and the nursing notes.  Pertinent labs & imaging results that were available during my care of the patient were reviewed by me and considered in my medical decision making (see chart for details).    Vitals:   07/01/16 0616 07/01/16 0617 07/01/16 0853 07/01/16 1041  BP: 148/79  133/70 128/68  Pulse: 65  63 78  Resp: 24  20 18   Temp: 99.3 F (37.4 C)     TempSrc: Rectal     SpO2: 100%  100% 99%  Weight:  81.6 kg    Height:  5\' 11"  (1.803 m)      Medications  iopamidol (ISOVUE-300) 61 % injection (not administered)  sodium chloride 0.9 % injection (not administered)  famotidine (PEPCID) IVPB 20 mg premix (0 mg Intravenous Stopped 07/01/16 0918)  sodium chloride 0.9 % bolus 2,000 mL (0 mLs Intravenous Stopped 07/01/16 0918)  glycopyrrolate (ROBINUL) injection 0.1 mg (0.1 mg Intravenous Given 07/01/16 0717)  sodium chloride 0.9 % bolus 2,000 mL (0 mLs Intravenous Stopped  07/01/16 1025)  gi cocktail (Maalox,Lidocaine,Donnatal) (30 mLs Oral Given 07/01/16 0925)  sucralfate (CARAFATE) 1 GM/10ML suspension 1 g (1 g Oral Given 07/01/16 0925)  iopamidol (ISOVUE-300) 61 % injection 100 mL (100 mLs Intravenous Contrast Given 07/01/16 0937)    Donevan Biller is 66 y.o. male presenting with Nausea vomiting diarrhea onset 4 days ago, it resolved 2 days ago however he's been eating and drinking less than normal. Patient with dry mucous membranes, he has myalgia and his roommate was recently diagnosed with  flu. Will aggressively hydrate, check basic blood work and flu.  Blood work with a significant anion gap of 19, no leukocytosis. 80 ketones in the urine and bicarbonate is low 14. Given the significant blood work abnormalities will obtain CT abdomen pelvis however think this is likely just from severe dehydration.  Repeat abdominal exam is benign, CAT scan negative. Recheck of chemistry shows improvement in anion gap 216, bicarbonate has improved to 16 as well, BUN normalized.  Evaluation does not show pathology that would require ongoing emergent intervention or inpatient treatment. Pt is hemodynamically stable and mentating appropriately. Discussed findings and plan with patient/guardian, who agrees with care plan. All questions answered. Return precautions discussed and outpatient follow up given.    Final Clinical Impressions(s) / ED Diagnoses   Final diagnoses:  Dehydration  Influenza A    New Prescriptions Discharge Medication List as of 07/01/2016  1:00 PM    START taking these medications   Details  dicyclomine (BENTYL) 10 MG capsule Take 2 capsules (20 mg total) by mouth 4 (four) times daily as needed for spasms., Starting Thu 07/01/2016, Print    famotidine (PEPCID) 20 MG tablet Take 1 tablet (20 mg total) by mouth 2 (two) times daily., Starting Thu 07/01/2016, Print    lidocaine (XYLOCAINE) 2 % solution Use as directed 15 mLs in the mouth or throat every 3 (three)  hours as needed., Starting Thu 07/01/2016, Print    ondansetron (ZOFRAN) 4 MG tablet Take 1 tablet (4 mg total) by mouth every 8 (eight) hours as needed for nausea or vomiting., Starting Thu 07/01/2016, Print         Wynetta Emeryicole Hurshell Dino, PA-C 07/01/16 [address]    Katherin Ramey, PA-C 07/01/16 1609    Rolland PorterMark James, MD 07/11/16 [phone]

## 2016-07-01 NOTE — ED Notes (Signed)
Patient received 4mg  zofran and 1,05300ml NSS en route via [facility]

## 2016-07-01 NOTE — ED Triage Notes (Signed)
C/o NV and body aches with chills X4 days.

## 2016-07-07 DIAGNOSIS — E119 Type 2 diabetes mellitus without complications: Secondary | ICD-10-CM | POA: Diagnosis not present

## 2016-07-07 DIAGNOSIS — E860 Dehydration: Secondary | ICD-10-CM | POA: Diagnosis not present

## 2016-07-07 DIAGNOSIS — Z7984 Long term (current) use of oral hypoglycemic drugs: Secondary | ICD-10-CM | POA: Diagnosis not present

## 2016-07-07 DIAGNOSIS — J111 Influenza due to unidentified influenza virus with other respiratory manifestations: Secondary | ICD-10-CM | POA: Diagnosis not present

## 2016-09-21 DIAGNOSIS — Z7984 Long term (current) use of oral hypoglycemic drugs: Secondary | ICD-10-CM | POA: Diagnosis not present

## 2016-09-21 DIAGNOSIS — Z87898 Personal history of other specified conditions: Secondary | ICD-10-CM | POA: Diagnosis not present

## 2016-09-21 DIAGNOSIS — E119 Type 2 diabetes mellitus without complications: Secondary | ICD-10-CM | POA: Diagnosis not present

## 2016-12-31 ENCOUNTER — Emergency Department (HOSPITAL_COMMUNITY): Payer: Medicare Other

## 2016-12-31 ENCOUNTER — Encounter (HOSPITAL_COMMUNITY): Payer: Self-pay

## 2016-12-31 ENCOUNTER — Inpatient Hospital Stay (HOSPITAL_COMMUNITY)
Admission: EM | Admit: 2016-12-31 | Discharge: 2017-01-02 | DRG: 303 | Disposition: A | Discharge status: Home / Self Care | Payer: Medicare Other | Attending: Cardiovascular Disease | Admitting: Cardiovascular Disease

## 2016-12-31 DIAGNOSIS — Z885 Allergy status to narcotic agent status: Secondary | ICD-10-CM

## 2016-12-31 DIAGNOSIS — I214 Non-ST elevation (NSTEMI) myocardial infarction: Secondary | ICD-10-CM

## 2016-12-31 DIAGNOSIS — E119 Type 2 diabetes mellitus without complications: Secondary | ICD-10-CM | POA: Diagnosis not present

## 2016-12-31 DIAGNOSIS — K649 Unspecified hemorrhoids: Secondary | ICD-10-CM | POA: Diagnosis present

## 2016-12-31 DIAGNOSIS — I5030 Unspecified diastolic (congestive) heart failure: Secondary | ICD-10-CM | POA: Diagnosis not present

## 2016-12-31 DIAGNOSIS — E785 Hyperlipidemia, unspecified: Secondary | ICD-10-CM | POA: Diagnosis present

## 2016-12-31 DIAGNOSIS — Z888 Allergy status to other drugs, medicaments and biological substances status: Secondary | ICD-10-CM

## 2016-12-31 DIAGNOSIS — R0789 Other chest pain: Secondary | ICD-10-CM | POA: Diagnosis not present

## 2016-12-31 DIAGNOSIS — I25700 Atherosclerosis of coronary artery bypass graft(s), unspecified, with unstable angina pectoris: Secondary | ICD-10-CM | POA: Diagnosis not present

## 2016-12-31 DIAGNOSIS — E1165 Type 2 diabetes mellitus with hyperglycemia: Secondary | ICD-10-CM

## 2016-12-31 DIAGNOSIS — Z87891 Personal history of nicotine dependence: Secondary | ICD-10-CM | POA: Diagnosis not present

## 2016-12-31 DIAGNOSIS — I341 Nonrheumatic mitral (valve) prolapse: Secondary | ICD-10-CM | POA: Diagnosis not present

## 2016-12-31 DIAGNOSIS — R7989 Other specified abnormal findings of blood chemistry: Secondary | ICD-10-CM | POA: Diagnosis present

## 2016-12-31 DIAGNOSIS — R072 Precordial pain: Secondary | ICD-10-CM | POA: Diagnosis not present

## 2016-12-31 DIAGNOSIS — Z823 Family history of stroke: Secondary | ICD-10-CM | POA: Diagnosis not present

## 2016-12-31 DIAGNOSIS — E782 Mixed hyperlipidemia: Secondary | ICD-10-CM | POA: Diagnosis not present

## 2016-12-31 DIAGNOSIS — I110 Hypertensive heart disease with heart failure: Secondary | ICD-10-CM | POA: Diagnosis not present

## 2016-12-31 DIAGNOSIS — I10 Essential (primary) hypertension: Secondary | ICD-10-CM | POA: Diagnosis not present

## 2016-12-31 DIAGNOSIS — I2510 Atherosclerotic heart disease of native coronary artery without angina pectoris: Principal | ICD-10-CM | POA: Diagnosis present

## 2016-12-31 DIAGNOSIS — R911 Solitary pulmonary nodule: Secondary | ICD-10-CM | POA: Diagnosis present

## 2016-12-31 DIAGNOSIS — R079 Chest pain, unspecified: Secondary | ICD-10-CM

## 2016-12-31 DIAGNOSIS — I200 Unstable angina: Secondary | ICD-10-CM | POA: Diagnosis not present

## 2016-12-31 HISTORY — DX: Viral meningitis, unspecified: A87.9

## 2016-12-31 HISTORY — DX: Type 2 diabetes mellitus without complications: E11.9

## 2016-12-31 HISTORY — DX: Gastro-esophageal reflux disease without esophagitis: K21.9

## 2016-12-31 LAB — PROTIME-INR
INR: 1.05
Prothrombin Time: 13.8 seconds (ref 11.4–15.2)

## 2016-12-31 LAB — CBC
HCT: 46.4 % (ref 39.0–52.0)
Hemoglobin: 17.1 g/dL — ABNORMAL HIGH (ref 13.0–17.0)
MCH: 32.6 pg (ref 26.0–34.0)
MCHC: 36.9 g/dL — ABNORMAL HIGH (ref 30.0–36.0)
MCV: 88.4 fL (ref 78.0–100.0)
Platelets: 184 10*3/uL (ref 150–400)
RBC: 5.25 MIL/uL (ref 4.22–5.81)
RDW: 12.5 % (ref 11.5–15.5)
WBC: 7.3 10*3/uL (ref 4.0–10.5)

## 2016-12-31 LAB — BASIC METABOLIC PANEL
Anion gap: 12 (ref 5–15)
BUN: 12 mg/dL (ref 6–20)
CO2: 21 mmol/L — ABNORMAL LOW (ref 22–32)
Calcium: 9.5 mg/dL (ref 8.9–10.3)
Chloride: 106 mmol/L (ref 101–111)
Creatinine, Ser: 0.86 mg/dL (ref 0.61–1.24)
GFR calc Af Amer: >60 mL/min (ref >60)
GFR calc non Af Amer: >60 mL/min (ref >60)
Glucose, Bld: 131 mg/dL — ABNORMAL HIGH (ref 65–99)
Potassium: 3.9 mmol/L (ref 3.5–5.1)
Sodium: 139 mmol/L (ref 135–145)

## 2016-12-31 LAB — I-STAT TROPONIN, ED: Troponin i, poc: 0.11 ng/mL (ref 0.00–0.08)

## 2016-12-31 LAB — GLUCOSE, CAPILLARY: Glucose-Capillary: 198 mg/dL — ABNORMAL HIGH (ref 65–99)

## 2016-12-31 LAB — TROPONIN I
Troponin I: <0.03 ng/mL (ref <0.03)
Troponin I: <0.03 ng/mL (ref <0.03)

## 2016-12-31 MED ORDER — HEPARIN (PORCINE) IN NACL 100-0.45 UNIT/ML-% IJ SOLN
1100.0000 [IU]/h | INTRAMUSCULAR | Status: DC
  Administered 2016-12-31: 950 [IU]/h via INTRAVENOUS
  Administered 2017-01-01: 1100 [IU]/h via INTRAVENOUS
  Filled 2016-12-31: qty 250

## 2016-12-31 MED ORDER — SODIUM CHLORIDE 0.9% FLUSH
3.0000 mL | Freq: Two times a day (BID) | INTRAVENOUS | Status: DC
  Administered 2017-01-01 (×2): 3 mL via INTRAVENOUS

## 2016-12-31 MED ORDER — FAMOTIDINE 20 MG PO TABS
20.0000 mg | ORAL_TABLET | Freq: Two times a day (BID) | ORAL | Status: DC
  Administered 2016-12-31 – 2017-01-02 (×4): 20 mg via ORAL
  Filled 2016-12-31 (×4): qty 1

## 2016-12-31 MED ORDER — ASPIRIN 81 MG PO CHEW
324.0000 mg | CHEWABLE_TABLET | Freq: Once | ORAL | Status: AC
  Administered 2016-12-31: 324 mg via ORAL
  Filled 2016-12-31: qty 4

## 2016-12-31 MED ORDER — CANAGLIFLOZIN 300 MG PO TABS
300.0000 mg | ORAL_TABLET | Freq: Every day | ORAL | Status: DC
  Administered 2017-01-02: 300 mg via ORAL
  Filled 2016-12-31: qty 1

## 2016-12-31 MED ORDER — ACETAMINOPHEN 325 MG PO TABS: 650.0000 mg | ORAL_TABLET | ORAL | Status: DC | PRN

## 2016-12-31 MED ORDER — INSULIN ASPART 100 UNIT/ML ~~LOC~~ SOLN
0.0000 [IU] | Freq: Three times a day (TID) | SUBCUTANEOUS | Status: DC
  Administered 2017-01-01: 1 [IU] via SUBCUTANEOUS
  Administered 2017-01-01: 2 [IU] via SUBCUTANEOUS
  Administered 2017-01-01: 1 [IU] via SUBCUTANEOUS
  Administered 2017-01-02 (×2): 2 [IU] via SUBCUTANEOUS

## 2016-12-31 MED ORDER — CARVEDILOL 3.125 MG PO TABS
3.1250 mg | ORAL_TABLET | Freq: Two times a day (BID) | ORAL | Status: DC
  Administered 2017-01-01 – 2017-01-02 (×3): 3.125 mg via ORAL
  Filled 2016-12-31 (×3): qty 1

## 2016-12-31 MED ORDER — ONDANSETRON HCL 4 MG/2ML IJ SOLN: 4.0000 mg | Freq: Four times a day (QID) | INTRAMUSCULAR | Status: DC | PRN

## 2016-12-31 MED ORDER — ASPIRIN 81 MG PO CHEW: 324.0000 mg | CHEWABLE_TABLET | ORAL | Status: AC

## 2016-12-31 MED ORDER — SODIUM CHLORIDE 0.9 % IV SOLN: 250.0000 mL | INTRAVENOUS | Status: DC | PRN

## 2016-12-31 MED ORDER — SODIUM CHLORIDE 0.9% FLUSH: 3.0000 mL | INTRAVENOUS | Status: DC | PRN

## 2016-12-31 MED ORDER — ONDANSETRON HCL 4 MG PO TABS: 4.0000 mg | ORAL_TABLET | Freq: Three times a day (TID) | ORAL | Status: DC | PRN

## 2016-12-31 MED ORDER — HEPARIN BOLUS VIA INFUSION
4000.0000 [IU] | Freq: Once | INTRAVENOUS | Status: AC
  Administered 2016-12-31: 4000 [IU] via INTRAVENOUS
  Filled 2016-12-31: qty 4000

## 2016-12-31 MED ORDER — ASPIRIN EC 81 MG PO TBEC
81.0000 mg | DELAYED_RELEASE_TABLET | Freq: Every day | ORAL | Status: DC
  Administered 2017-01-01 – 2017-01-02 (×2): 81 mg via ORAL
  Filled 2016-12-31 (×2): qty 1

## 2016-12-31 MED ORDER — NITROGLYCERIN 0.4 MG SL SUBL: 0.4000 mg | SUBLINGUAL_TABLET | SUBLINGUAL | Status: DC | PRN

## 2016-12-31 MED ORDER — ASPIRIN 300 MG RE SUPP
300.0000 mg | RECTAL | Status: AC
  Filled 2016-12-31: qty 1

## 2016-12-31 MED ORDER — DICYCLOMINE HCL 10 MG PO CAPS: 20.0000 mg | ORAL_CAPSULE | Freq: Four times a day (QID) | ORAL | Status: DC | PRN

## 2016-12-31 MED ORDER — ATORVASTATIN CALCIUM 80 MG PO TABS
80.0000 mg | ORAL_TABLET | Freq: Every day | ORAL | Status: DC
  Administered 2017-01-02: 80 mg via ORAL
  Filled 2016-12-31 (×2): qty 1

## 2016-12-31 NOTE — H&P (Signed)
Cardiology H&P:   Patient ID: Matthew Lozano; [id number]; 12/21/1950   Admit date: 12/31/2016 Date of Consult: 12/31/2016  Primary Care Provider: Catha GosselinLittle, Kevin, MD Primary Cardiologist: new Elsie Lincoln(Gamble in past)    Patient Profile:   Matthew Lozano is a 66 y.o. male with a hx of mitral alve prolapse and type 2 diabetes mellitus who is being seen today for the evaluation of possible non-STEMI.  History of Present Illness:   Matthew Lozano began experiencing chest pressure in the retrosternal area around 9 PM last night. He did not notice any factors that worsened or attenuated to discomfort. He managed to sleep through the night, but the chest discomfort was still present when he woke up in the morning. He has never experienced it before. At its most severe it was a roughly 5/10. It has now resolved completely in the emergency room. She denies accompanying diaphoresis, nausea, dyspnea, near-syncope or palpitations.  He does not engage in routine physical activity but does play golf. He has successfully lost about 40 pounds in an attempt to better control his diabetes mellitus. It has been easier to do this since he retired as Chief Strategy Officerowner of a restaurant. He does not smoke cigarettes and has never done so. There is no family history of premature coronary artery disease. His grandfather died suddenly after being pronounced to be in excellent health around the age of 66.  He was diagnosed with mitral valve prolapse over 30 years ago, but this was not confirmed by echocardiography. He had normal nuclear stress tests in the past, none performed in the last 10 years.    Past Medical History:  Diagnosis Date  . Diabetes mellitus without complication (HCC)    2008  . Hemorrhoids   . MVP (mitral valve prolapse)     Past Surgical History:  Procedure Laterality Date  . LIPOMA EXCISION     OF BACK      Inpatient Medications: Scheduled Meds:  Continuous Infusions: . heparin 950 Units/hr (12/31/16 1747)    PRN Meds:   Allergies:    Allergies  Allergen Reactions  . Codeine Other (See Comments)    Reaction:  GI upset   . Metformin And Related Diarrhea  . Phenergan [Promethazine Hcl] Diarrhea  . Androgel [Testosterone] Rash    Social History:   Social History   Social History  . Marital status: Single    Spouse name: N/A  . Number of children: N/A  . Years of education: N/A   Occupational History  . Not on file.   Social History Main Topics  . Smoking status: Former Smoker    Years: 10.00  . Smokeless tobacco: Never Used  . Alcohol use No  . Drug use: No  . Sexual activity: Not on file   Other Topics Concern  . Not on file   Social History Narrative  . No narrative on file    Family History:    Family History  Problem Relation Age of Onset  . CVA Father   . Other Brother        AIDS     ROS:  Please see the history of present illness.  ROS  All other ROS reviewed and negative.     Physical Exam/Data:   Vitals:   12/31/16 1614 12/31/16 1619 12/31/16 1730  BP:  133/85 (!) 146/87  Pulse:  64 74  Resp:  18 (!) 22  Temp:  98.1 F (36.7 C)   TempSrc:  Oral   SpO2:  98% 100%  Weight: 172 lb (78 kg)    Height: 5\' 11"  (1.803 m)     No intake or output data in the 24 hours ending 12/31/16 1817 Filed Weights   12/31/16 1614  Weight: 172 lb (78 kg)   Body mass index is 23.99 kg/m.  General:  Well nourished, well developed, in no acute distress HEENT: normal Lymph: no adenopathy Neck: no JVD Endocrine:  No thryomegaly Vascular: No carotid bruits; FA pulses 2+ bilaterally without bruits  Cardiac:  normal S1, S2; RRR; no murmur . He does have a very distinct systolic click, but no murmur can be elicited even with the Valsalva maneuver. The click is feet appear to become more prominent fofollowing Valsalva. Lungs:  clear to auscultation bilaterally, no wheezing, rhonchi or rales  Abd: soft, nontender, no hepatomegaly  Ext: no edema Musculoskeletal:   No deformities, BUE and BLE strength normal and equal Skin: warm and dry  Neuro:  CNs 2-12 intact, no focal abnormalities noted Psych:  Normal affect   EKG:  The EKG was personally reviewed and demonstrates:  Normal sinus rhythm without any repolarization abnormalities Telemetry:  Telemetry was personally reviewed and demonstrates:  Normal sinus rhythm  Relevant CV Studies: Reviewed echo from 2014  Laboratory Data:  Chemistry Recent Labs Lab 12/31/16 1617  NA 139  K 3.9  CL 106  CO2 21*  GLUCOSE 131*  BUN 12  CREATININE 0.86  CALCIUM 9.5  GFRNONAA >60  GFRAA >60  ANIONGAP 12    No results for input(s): PROT, ALBUMIN, AST, ALT, ALKPHOS, BILITOT in the last 168 hours. Hematology Recent Labs Lab 12/31/16 1617  WBC 7.3  RBC 5.25  HGB 17.1*  HCT 46.4  MCV 88.4  MCH 32.6  MCHC 36.9*  RDW 12.5  PLT 184   Cardiac EnzymesNo results for input(s): TROPONINI in the last 168 hours.  Recent Labs Lab 12/31/16 1639  TROPIPOC 0.11*    BNPNo results for input(s): BNP, PROBNP in the last 168 hours.  DDimer No results for input(s): DDIMER in the last 168 hours.  Radiology/Studies:  Dg Chest 2 View  Result Date: 12/31/2016 CLINICAL DATA:  Chest pain. EXAM: CHEST  2 VIEW COMPARISON:  None. FINDINGS: The heart size and mediastinal contours are within normal limits. Both lungs are clear. The visualized skeletal structures are unremarkable. IMPRESSION: No active cardiopulmonary disease. Electronically Signed   By: Marin Roberts M.D.   On: 12/31/2016 16:47    Assessment and Plan:   1. Unstable angina/NSTEMI: Symptoms could be consistent with an acute coronary event and he does have coronary risk factors (diabetes mellitus, male gender, age) but the EKG is remarkably normal and the increase in cardiac troponin is very mild. He is hemodynamically well compensated and is now asymptomatic. Will keep on intravenous heparin for now. Recheck troponin. If these are clearly higher  than the right course of action would be to proceed directly to coronary angiography. If not, would risk stratify him further with echo and Lexiscan Myoview to see whether any sustained a hospital until Monday for an angiogram or can be managed more conservatively. 2. DM: On invokanna monotherapy. SSI. Check lipid profile. Start high intensity statin, dose to be tailored depending on results of further workup 3. MVP: Unsure about the accuracy of this diagnosis, but will reevaluate by echo tomorrow. His symptoms are not suggestive of mitral valve prolapse and this could not explain his abnormal cardiac enzymes.   Signed, Thurmon Fair, MD  12/31/2016  6:17 PM

## 2016-12-31 NOTE — Progress Notes (Signed)
ANTICOAGULATION CONSULT NOTE  Pharmacy Consult for heparin Indication: chest pain/ACS  Heparin Dosing Weight: 78 kg   Assessment: 66 yom presenting with CP. Pharmacy consulted to dose heparin for ACS. Not on anticoagulation PTA. CBC ok. No bleed documented.  Goal of Therapy:  Heparin level 0.3-0.7 units/ml Monitor platelets by anticoagulation protocol: Yes   Plan:  Heparin 4000 unit bolus Start heparin at 950 units/h 6h heparin level Daily heparin level/CBC Monitor s/sx bleeding   Matthew BertinHaley Shaena Lozano, PharmD, BCPS Clinical Pharmacist 12/31/2016 5:16 PM

## 2016-12-31 NOTE — ED Provider Notes (Signed)
[facility]-EMERGENCY DEPT Provider Note   CSN: [id number] Arrival date & time: 12/31/16  1605     History   Chief Complaint Chief Complaint  Patient presents with  . Chest Pain    HPI Matthew Lozano is a 66 y.o. male.  The history is provided by the patient. No language interpreter was used.  Chest Pain      Matthew EvensDrew Lozano is a 66 y.o. male who presents to the Emergency Department complaining of chest pain.  He reports central chest pain as being constant since last night. No fevers, shortness of breath, nausea, vomiting, diaphoresis. No prior similar symptoms. He saw his PCP today who referred him to the emergency department for further evaluation. Reports history of stress test but this was greater than 10 years ago.  Past Medical History:  Diagnosis Date  . Diabetes mellitus without complication (HCC)    2008  . Hemorrhoids   . MVP (mitral valve prolapse)     Patient Active Problem List   Diagnosis Date Noted  . Type II or unspecified type diabetes mellitus without mention of complication, uncontrolled 10/26/2013  . MVP (mitral valve prolapse)     Past Surgical History:  Procedure Laterality Date  . LIPOMA EXCISION     OF BACK        Home Medications    Prior to Admission medications   Medication Sig Start Date End Date Taking? Authorizing Provider  bismuth subsalicylate (PEPTO BISMOL) 262 MG/15ML suspension Take 30 mLs by mouth every 6 (six) hours as needed for indigestion.    [provider]  calcium carbonate (TUMS - DOSED IN MG ELEMENTAL CALCIUM) 500 MG chewable tablet Chew 2 tablets by mouth as needed for indigestion or heartburn.    [provider]  canagliflozin (INVOKANA) 300 MG TABS tablet Take 300 mg by mouth daily before breakfast.    [provider]  dicyclomine (BENTYL) 10 MG capsule Take 2 capsules (20 mg total) by mouth 4 (four) times daily as needed for spasms. 07/01/16   Pisciotta, Joni ReiningNicole, PA-C  famotidine (PEPCID) 20 MG tablet  Take 1 tablet (20 mg total) by mouth 2 (two) times daily. 07/01/16   Pisciotta, Joni ReiningNicole, PA-C  lidocaine (XYLOCAINE) 2 % solution Use as directed 15 mLs in the mouth or throat every 3 (three) hours as needed. 07/01/16   Pisciotta, Joni ReiningNicole, PA-C  ondansetron (ZOFRAN) 4 MG tablet Take 1 tablet (4 mg total) by mouth every 8 (eight) hours as needed for nausea or vomiting. 07/01/16   Pisciotta, Joni ReiningNicole, PA-C    Family History Family History  Problem Relation Age of Onset  . CVA Father   . Other Brother        AIDS    Social History Social History  Substance Use Topics  . Smoking status: Former Smoker    Years: 10.00  . Smokeless tobacco: Never Used  . Alcohol use No     Allergies   Codeine; Phenergan [promethazine hcl]; and Androgel [testosterone]   Review of Systems Review of Systems  Cardiovascular: Positive for chest pain.  All other systems reviewed and are negative.    Physical Exam Updated Vital Signs BP 133/85 (BP Location: Right Arm)   Pulse 64   Temp 98.1 F (36.7 C) (Oral)   Resp 18   Ht 5\' 11"  (1.803 m)   Wt 78 kg (172 lb)   SpO2 98%   BMI 23.99 kg/m   Physical Exam  Constitutional: He is oriented to person, place, and  time. He appears well-developed and well-nourished.  HENT:  Head: Normocephalic and atraumatic.  Cardiovascular: Normal rate and regular rhythm.   No murmur heard. Pulmonary/Chest: Effort normal and breath sounds normal. No respiratory distress.  Abdominal: Soft. There is no tenderness. There is no rebound and no guarding.  Musculoskeletal: He exhibits no edema or tenderness.  Neurological: He is alert and oriented to person, place, and time.  Skin: Skin is warm and dry.  Psychiatric: He has a normal mood and affect. His behavior is normal.  Nursing note and vitals reviewed.    ED Treatments / Results  Labs (all labs ordered are listed, but only abnormal results are displayed) Labs Reviewed  BASIC METABOLIC PANEL - Abnormal; Notable for  the following:       Result Value   CO2 21 (*)    Glucose, Bld 131 (*)    All other components within normal limits  CBC - Abnormal; Notable for the following:    Hemoglobin 17.1 (*)    MCHC 36.9 (*)    All other components within normal limits  I-STAT TROPONIN, ED - Abnormal; Notable for the following:    Troponin i, poc 0.11 (*)    All other components within normal limits  PROTIME-INR    EKG  EKG Interpretation None       Radiology Dg Chest 2 View  Result Date: 12/31/2016 CLINICAL DATA:  Chest pain. EXAM: CHEST  2 VIEW COMPARISON:  None. FINDINGS: The heart size and mediastinal contours are within normal limits. Both lungs are clear. The visualized skeletal structures are unremarkable. IMPRESSION: No active cardiopulmonary disease. Electronically Signed   By: Marin Roberts M.D.   On: 12/31/2016 16:47    Procedures Procedures (including critical care time)  Medications Ordered in ED Medications - No data to display   Initial Impression / Assessment and Plan / ED Course  I have reviewed the triage vital signs and the nursing notes.  Pertinent labs & imaging results that were available during my care of the patient were reviewed by me and considered in my medical decision making (see chart for details).     Patient here for evaluation of chest pain since yesterday. He does have a history of diabetes. EKG with no acute ischemic changes. Troponin is elevated. He has no historical features concerning for PE or dissection. Concern for non-ST elevation MI given troponin leak. Cardiology consulted for further management. History on heparin drip and given aspirin. He is pain-free on ED evaluation.  Final Clinical Impressions(s) / ED Diagnoses   Final diagnoses:  Chest pain  Chest pain    New Prescriptions New Prescriptions   No medications on file     Tilden Fossa, MD 01/01/17 1545

## 2016-12-31 NOTE — ED Notes (Signed)
Heparin drip verified by brian rn  And bolus

## 2016-12-31 NOTE — ED Notes (Signed)
Dr. Madilyn Hookees came up and spoke with pt. In triage and verbalized after second ECG is completed and she reviews it. Pt. May go to Waiting area.  ECG completed and shown to Dr. Madilyn Hookees by Derek Moundicardo.  Pt. Is able to go to the Waiting area.

## 2016-12-31 NOTE — ED Notes (Signed)
Iv heparin drip started

## 2016-12-31 NOTE — ED Notes (Signed)
Pt.s paper work given to Pt.

## 2017-01-01 ENCOUNTER — Inpatient Hospital Stay (HOSPITAL_COMMUNITY): Payer: Medicare Other

## 2017-01-01 ENCOUNTER — Encounter (HOSPITAL_COMMUNITY): Payer: Self-pay | Admitting: *Deleted

## 2017-01-01 ENCOUNTER — Other Ambulatory Visit: Payer: Self-pay

## 2017-01-01 DIAGNOSIS — I5030 Unspecified diastolic (congestive) heart failure: Secondary | ICD-10-CM | POA: Diagnosis not present

## 2017-01-01 DIAGNOSIS — R7989 Other specified abnormal findings of blood chemistry: Secondary | ICD-10-CM | POA: Diagnosis not present

## 2017-01-01 DIAGNOSIS — Z885 Allergy status to narcotic agent status: Secondary | ICD-10-CM | POA: Diagnosis not present

## 2017-01-01 DIAGNOSIS — R079 Chest pain, unspecified: Secondary | ICD-10-CM

## 2017-01-01 DIAGNOSIS — Z87891 Personal history of nicotine dependence: Secondary | ICD-10-CM | POA: Diagnosis not present

## 2017-01-01 DIAGNOSIS — I10 Essential (primary) hypertension: Secondary | ICD-10-CM | POA: Diagnosis not present

## 2017-01-01 DIAGNOSIS — Z823 Family history of stroke: Secondary | ICD-10-CM | POA: Diagnosis not present

## 2017-01-01 DIAGNOSIS — I110 Hypertensive heart disease with heart failure: Secondary | ICD-10-CM | POA: Diagnosis not present

## 2017-01-01 DIAGNOSIS — I214 Non-ST elevation (NSTEMI) myocardial infarction: Secondary | ICD-10-CM | POA: Diagnosis not present

## 2017-01-01 DIAGNOSIS — E782 Mixed hyperlipidemia: Secondary | ICD-10-CM

## 2017-01-01 DIAGNOSIS — R911 Solitary pulmonary nodule: Secondary | ICD-10-CM | POA: Diagnosis not present

## 2017-01-01 DIAGNOSIS — I341 Nonrheumatic mitral (valve) prolapse: Secondary | ICD-10-CM | POA: Diagnosis not present

## 2017-01-01 DIAGNOSIS — I2510 Atherosclerotic heart disease of native coronary artery without angina pectoris: Secondary | ICD-10-CM | POA: Diagnosis not present

## 2017-01-01 DIAGNOSIS — E119 Type 2 diabetes mellitus without complications: Secondary | ICD-10-CM | POA: Diagnosis not present

## 2017-01-01 DIAGNOSIS — R072 Precordial pain: Secondary | ICD-10-CM | POA: Diagnosis not present

## 2017-01-01 DIAGNOSIS — Z888 Allergy status to other drugs, medicaments and biological substances status: Secondary | ICD-10-CM | POA: Diagnosis not present

## 2017-01-01 DIAGNOSIS — E785 Hyperlipidemia, unspecified: Secondary | ICD-10-CM | POA: Diagnosis not present

## 2017-01-01 LAB — GLUCOSE, CAPILLARY
Glucose-Capillary: 134 mg/dL — ABNORMAL HIGH (ref 65–99)
Glucose-Capillary: 135 mg/dL — ABNORMAL HIGH (ref 65–99)
Glucose-Capillary: 156 mg/dL — ABNORMAL HIGH (ref 65–99)
Glucose-Capillary: 209 mg/dL — ABNORMAL HIGH (ref 65–99)

## 2017-01-01 LAB — LIPID PANEL
Cholesterol: 146 mg/dL (ref 0–200)
HDL: 50 mg/dL (ref >40)
LDL Cholesterol: 82 mg/dL (ref 0–99)
Total CHOL/HDL Ratio: 2.9 RATIO
Triglycerides: 69 mg/dL (ref <150)
VLDL: 14 mg/dL (ref 0–40)

## 2017-01-01 LAB — CBC
HCT: 43.7 % (ref 39.0–52.0)
Hemoglobin: 15.9 g/dL (ref 13.0–17.0)
MCH: 31.9 pg (ref 26.0–34.0)
MCHC: 36.4 g/dL — ABNORMAL HIGH (ref 30.0–36.0)
MCV: 87.6 fL (ref 78.0–100.0)
Platelets: 162 10*3/uL (ref 150–400)
RBC: 4.99 MIL/uL (ref 4.22–5.81)
RDW: 12.2 % (ref 11.5–15.5)
WBC: 6.3 10*3/uL (ref 4.0–10.5)

## 2017-01-01 LAB — HEPARIN LEVEL (UNFRACTIONATED)
Heparin Unfractionated: 0.25 IU/mL — ABNORMAL LOW (ref 0.30–0.70)
Heparin Unfractionated: 0.32 IU/mL (ref 0.30–0.70)
Heparin Unfractionated: 0.43 IU/mL (ref 0.30–0.70)

## 2017-01-01 LAB — TROPONIN I
Troponin I: <0.03 ng/mL (ref <0.03)
Troponin I: <0.03 ng/mL (ref <0.03)

## 2017-01-01 MED ORDER — IOPAMIDOL (ISOVUE-370) INJECTION 76%
INTRAVENOUS | Status: AC
  Administered 2017-01-01: 80 mL
  Filled 2017-01-01: qty 100

## 2017-01-01 MED ORDER — NITROGLYCERIN 0.4 MG SL SUBL
0.8000 mg | SUBLINGUAL_TABLET | SUBLINGUAL | Status: DC | PRN
  Administered 2017-01-01: 0.8 mg via SUBLINGUAL
  Filled 2017-01-01: qty 2

## 2017-01-01 NOTE — Progress Notes (Signed)
ANTICOAGULATION CONSULT NOTE - Follow Up Consult  Pharmacy Consult for heparin Indication: chest pain/ACS  Labs:  Recent Labs  12/31/16 1617 12/31/16 1732 12/31/16 2008 12/31/16 2226 12/31/16 2352  HGB 17.1*  --   --   --   --   HCT 46.4  --   --   --   --   PLT 184  --   --   --   --   LABPROT  --  13.8  --   --   --   INR  --  1.05  --   --   --   HEPARINUNFRC  --   --   --   --  0.25*  CREATININE 0.86  --   --   --   --   TROPONINI  --   --  <0.03 <0.03  --     Assessment: 66yo male subtherapeutic on heparin with initial dosing for CP.  Goal of Therapy:  Heparin level 0.3-0.7 units/ml  Plan:  Will increase heparin gtt by 2 units/kg/hr to 1100 units/hr and check level with closest lab draw.  Vernard GamblesVeronda Juhi Lagrange, PharmD, BCPS  01/01/2017,1:25 AM

## 2017-01-01 NOTE — Progress Notes (Signed)
Refused bed alarm. Will continue to monitor patient. 

## 2017-01-01 NOTE — Progress Notes (Signed)
ANTICOAGULATION FOLLOW UP NOTE  Pharmacy Consult for heparin Indication: chest pain/ACS  Heparin Dosing Weight: 78 kg  Assessment: 66 yom presenting with chest pain. Pharmacy consulted to dose heparin for ACS. Not on anticoagulation PTA. CBC within normal limits. No bleed documented. Heparin level this morning was therapeutic.   Goal of Therapy:  Heparin level 0.3-0.7 units/ml Monitor platelets by anticoagulation protocol: Yes   Plan:  Continue heparin drip at 1100 units/ hours Daily heparin level/CBC Monitor s/sx bleeding   Matthew Lozano, Pharm.D. PGY1 Pharmacy Resident 01/01/2017 11:01 AM Main Pharmacy: [phone]

## 2017-01-01 NOTE — Progress Notes (Signed)
VSS 7 a to 7 p shift, patient denies any chest pain or dyspnea.

## 2017-01-01 NOTE — Progress Notes (Signed)
Cardiology Progress Note   Patient ID: Matthew Lozano; [id number]; [birth year]   Admit date: 12/31/2016 Date of Consult: 01/01/2017  Primary Care Provider: Catha Gosselin, MD Primary Cardiologist: new Elsie Lincoln in past)  Subjective:   No chest pain overnight.  Inpatient Medications: Scheduled Meds: . aspirin  324 mg Oral NOW   Or  . aspirin  300 mg Rectal NOW  . aspirin EC  81 mg Oral Daily  . atorvastatin  80 mg Oral q1800  . [START ON 01/02/2017] canagliflozin  300 mg Oral QAC breakfast  . carvedilol  3.125 mg Oral BID WC  . famotidine  20 mg Oral BID  . insulin aspart  0-9 Units Subcutaneous TID WC  . sodium chloride flush  3 mL Intravenous Q12H   Continuous Infusions: . sodium chloride    . heparin 1,100 Units/hr (01/01/17 0159)   PRN Meds:   Allergies:    Allergies  Allergen Reactions  . Codeine Other (See Comments)    Reaction:  GI upset   . Metformin And Related Diarrhea  . Phenergan [Promethazine Hcl] Diarrhea  . Androgel [Testosterone] Rash    Social History:   Social History   Social History  . Marital status: Divorced    Spouse name: N/A  . Number of children: N/A  . Years of education: N/A   Occupational History  . Not on file.   Social History Main Topics  . Smoking status: Former Smoker    Packs/day: 0.50    Years: 10.00    Types: Cigarettes    Quit date: 31  . Smokeless tobacco: Never Used  . Alcohol use Yes     Comment: 12/31/2016 "stopped 11/2016"  . Drug use: Yes    Types: Marijuana     Comment: 12/31/2016 "weekly"  . Sexual activity: Not Currently   Other Topics Concern  . Not on file   Social History Narrative  . No narrative on file    Family History:    Family History  Problem Relation Age of Onset  . CVA Father   . Other Brother        AIDS     ROS:  Please see the history of present illness.  ROS  All other ROS reviewed and negative.     Physical Exam/Data:   Vitals:   12/31/16 2000 12/31/16 2035 01/01/17 0022  01/01/17 0707  BP: 131/72 126/71 121/65 120/66  Pulse: 65 63 (!) 57 60  Resp: 19 18 18 18   Temp:  98.4 F (36.9 C) 98 F (36.7 C) 98 F (36.7 C)  TempSrc:  Oral Oral Oral  SpO2: 97% 96% 98% 98%  Weight:  171 lb 8.6 oz (77.8 kg)  170 lb 3.1 oz (77.2 kg)  Height:  5\' 11"  (1.803 m)      Intake/Output Summary (Last 24 hours) at 01/01/17 0825 Last data filed at 01/01/17 0659  Gross per 24 hour  Intake              462 ml  Output              750 ml  Net             -288 ml   Filed Weights   12/31/16 1614 12/31/16 2035 01/01/17 0707  Weight: 172 lb (78 kg) 171 lb 8.6 oz (77.8 kg) 170 lb 3.1 oz (77.2 kg)   Body mass index is 23.74 kg/m.  General:  Well nourished, well developed, in no acute distress  HEENT: normal Lymph: no adenopathy Neck: no JVD Endocrine:  No thryomegaly Vascular: No carotid bruits; FA pulses 2+ bilaterally without bruits  Cardiac:  normal S1, S2; RRR; no murmur . He does have a very distinct systolic click, but no murmur can be elicited even with the Valsalva maneuver. The click is feet appear to become more prominent fofollowing Valsalva. Lungs:  clear to auscultation bilaterally, no wheezing, rhonchi or rales  Abd: soft, nontender, no hepatomegaly  Ext: no edema Musculoskeletal:  No deformities, BUE and BLE strength normal and equal Skin: warm and dry  Neuro:  CNs 2-12 intact, no focal abnormalities noted Psych:  Normal affect   EKG:  The EKG was personally reviewed and demonstrates:  Normal sinus rhythm without any repolarization abnormalities Telemetry:  Telemetry was personally reviewed and demonstrates:  Normal sinus rhythm  Relevant CV Studies: Reviewed echo from 2014  Laboratory Data:  Chemistry  Recent Labs Lab 12/31/16 1617  NA 139  K 3.9  CL 106  CO2 21*  GLUCOSE 131*  BUN 12  CREATININE 0.86  CALCIUM 9.5  GFRNONAA >60  GFRAA >60  ANIONGAP 12    No results for input(s): PROT, ALBUMIN, AST, ALT, ALKPHOS, BILITOT in the last 168  hours. Hematology  Recent Labs Lab 12/31/16 1617 01/01/17 0408  WBC 7.3 6.3  RBC 5.25 4.99  HGB 17.1* 15.9  HCT 46.4 43.7  MCV 88.4 87.6  MCH 32.6 31.9  MCHC 36.9* 36.4*  RDW 12.5 12.2  PLT 184 162   Cardiac Enzymes  Recent Labs Lab 12/31/16 2008 12/31/16 2226 01/01/17 0408  TROPONINI <0.03 <0.03 <0.03     Recent Labs Lab 12/31/16 1639  TROPIPOC 0.11*    Radiology/Studies:  Dg Chest 2 View  Result Date: 12/31/2016 CLINICAL DATA:  Chest pain. EXAM: CHEST  2 VIEW COMPARISON:  None. FINDINGS: The heart size and mediastinal contours are within normal limits. Both lungs are clear. The visualized skeletal structures are unremarkable. IMPRESSION: No active cardiopulmonary disease. Electronically Signed   By: Marin Robertshristopher  Mattern M.D.   On: 12/31/2016 16:47     Assessment and Plan:   1. Unstable angina/NSTEMI: Symptoms could be consistent with an acute coronary event and he does have coronary risk factors (diabetes mellitus, male gender, age) but the EKG is remarkably normal and the increase in cardiac troponin is very mild. He is hemodynamically well compensated and is now asymptomatic. Will keep on intravenous heparin for now. Recheck troponins all negative. We will plan for a coronary CTA/CT FFR this am. 2. DM: On invokanna monotherapy. SSI. Check lipid profile. Start high intensity statin, dose to be tailored depending on results of further workup 3. MVP: Unsure about the accuracy of this diagnosis, but will reevaluate by echo tomorrow. His symptoms are not suggestive of mitral valve prolapse and this could not explain his abnormal cardiac enzymes.  Signed, Tobias AlexanderKatarina Daysi Boggan, MD  01/01/2017 8:25 AM

## 2017-01-02 ENCOUNTER — Other Ambulatory Visit: Payer: Self-pay

## 2017-01-02 ENCOUNTER — Inpatient Hospital Stay (HOSPITAL_COMMUNITY): Payer: Medicare Other

## 2017-01-02 DIAGNOSIS — I200 Unstable angina: Secondary | ICD-10-CM | POA: Diagnosis not present

## 2017-01-02 DIAGNOSIS — I110 Hypertensive heart disease with heart failure: Secondary | ICD-10-CM | POA: Diagnosis not present

## 2017-01-02 DIAGNOSIS — I10 Essential (primary) hypertension: Secondary | ICD-10-CM | POA: Diagnosis not present

## 2017-01-02 DIAGNOSIS — Z885 Allergy status to narcotic agent status: Secondary | ICD-10-CM | POA: Diagnosis not present

## 2017-01-02 DIAGNOSIS — E782 Mixed hyperlipidemia: Secondary | ICD-10-CM | POA: Diagnosis not present

## 2017-01-02 DIAGNOSIS — R072 Precordial pain: Secondary | ICD-10-CM | POA: Diagnosis not present

## 2017-01-02 DIAGNOSIS — I25700 Atherosclerosis of coronary artery bypass graft(s), unspecified, with unstable angina pectoris: Secondary | ICD-10-CM

## 2017-01-02 DIAGNOSIS — E119 Type 2 diabetes mellitus without complications: Secondary | ICD-10-CM | POA: Diagnosis not present

## 2017-01-02 DIAGNOSIS — R911 Solitary pulmonary nodule: Secondary | ICD-10-CM | POA: Diagnosis not present

## 2017-01-02 DIAGNOSIS — R7989 Other specified abnormal findings of blood chemistry: Secondary | ICD-10-CM | POA: Diagnosis not present

## 2017-01-02 DIAGNOSIS — R079 Chest pain, unspecified: Secondary | ICD-10-CM | POA: Diagnosis not present

## 2017-01-02 DIAGNOSIS — E785 Hyperlipidemia, unspecified: Secondary | ICD-10-CM | POA: Diagnosis not present

## 2017-01-02 DIAGNOSIS — I341 Nonrheumatic mitral (valve) prolapse: Secondary | ICD-10-CM | POA: Diagnosis not present

## 2017-01-02 DIAGNOSIS — I5030 Unspecified diastolic (congestive) heart failure: Secondary | ICD-10-CM | POA: Diagnosis not present

## 2017-01-02 DIAGNOSIS — Z888 Allergy status to other drugs, medicaments and biological substances status: Secondary | ICD-10-CM | POA: Diagnosis not present

## 2017-01-02 DIAGNOSIS — Z823 Family history of stroke: Secondary | ICD-10-CM | POA: Diagnosis not present

## 2017-01-02 DIAGNOSIS — I2510 Atherosclerotic heart disease of native coronary artery without angina pectoris: Secondary | ICD-10-CM | POA: Diagnosis not present

## 2017-01-02 DIAGNOSIS — Z87891 Personal history of nicotine dependence: Secondary | ICD-10-CM | POA: Diagnosis not present

## 2017-01-02 LAB — CBC
HCT: 43.1 % (ref 39.0–52.0)
Hemoglobin: 15.9 g/dL (ref 13.0–17.0)
MCH: 31.9 pg (ref 26.0–34.0)
MCHC: 36.9 g/dL — ABNORMAL HIGH (ref 30.0–36.0)
MCV: 86.5 fL (ref 78.0–100.0)
Platelets: 161 10*3/uL (ref 150–400)
RBC: 4.98 MIL/uL (ref 4.22–5.81)
RDW: 12.2 % (ref 11.5–15.5)
WBC: 6.4 10*3/uL (ref 4.0–10.5)

## 2017-01-02 LAB — ECHOCARDIOGRAM COMPLETE
Height: 71 in
Weight: 2724.8 oz

## 2017-01-02 LAB — GLUCOSE, CAPILLARY
Glucose-Capillary: 155 mg/dL — ABNORMAL HIGH (ref 65–99)
Glucose-Capillary: 122 mg/dL — ABNORMAL HIGH (ref 65–99)

## 2017-01-02 LAB — HEMOGLOBIN A1C
Hgb A1c MFr Bld: 7.7 % — ABNORMAL HIGH (ref 4.8–5.6)
Mean Plasma Glucose: 174 mg/dL

## 2017-01-02 LAB — HEPARIN LEVEL (UNFRACTIONATED): Heparin Unfractionated: 0.43 IU/mL (ref 0.30–0.70)

## 2017-01-02 MED ORDER — NITROGLYCERIN 0.4 MG SL SUBL: 0.4000 mg | SUBLINGUAL_TABLET | SUBLINGUAL | 2 refills | Status: DC | PRN

## 2017-01-02 MED ORDER — CARVEDILOL 3.125 MG PO TABS: 3.1250 mg | ORAL_TABLET | Freq: Two times a day (BID) | ORAL | 5 refills | Status: DC

## 2017-01-02 MED ORDER — ATORVASTATIN CALCIUM 80 MG PO TABS: 80.0000 mg | ORAL_TABLET | Freq: Every day | ORAL | 5 refills | Status: DC

## 2017-01-02 MED ORDER — ASPIRIN 81 MG PO TBEC: 81.0000 mg | DELAYED_RELEASE_TABLET | Freq: Every day | ORAL | Status: DC

## 2017-01-02 NOTE — Progress Notes (Signed)
Cardiology Progress Note   Patient ID: Kwadwo Taras; [id number]; 05/23/1951   Admit date: 12/31/2016 Date of Consult: 01/02/2017  Primary Care Provider: Catha Gosselin, MD Primary Cardiologist: new Elsie Lincoln in past)  Subjective:   No chest pain overnight. No SOB.  Inpatient Medications: Scheduled Meds: . aspirin EC  81 mg Oral Daily  . atorvastatin  80 mg Oral q1800  . canagliflozin  300 mg Oral QAC breakfast  . carvedilol  3.125 mg Oral BID WC  . famotidine  20 mg Oral BID  . insulin aspart  0-9 Units Subcutaneous TID WC  . sodium chloride flush  3 mL Intravenous Q12H   Continuous Infusions: . sodium chloride    . heparin 1,100 Units/hr (01/01/17 1452)   PRN Meds:   Allergies:    Allergies  Allergen Reactions  . Codeine Other (See Comments)    Reaction:  GI upset   . Metformin And Related Diarrhea  . Phenergan [Promethazine Hcl] Diarrhea  . Androgel [Testosterone] Rash    Social History:   Social History   Social History  . Marital status: Divorced    Spouse name: N/A  . Number of children: N/A  . Years of education: N/A   Occupational History  . Not on file.   Social History Main Topics  . Smoking status: Former Smoker    Packs/day: 0.50    Years: 10.00    Types: Cigarettes    Quit date: 23  . Smokeless tobacco: Never Used  . Alcohol use Yes     Comment: 12/31/2016 "stopped 11/2016"  . Drug use: Yes    Types: Marijuana     Comment: 12/31/2016 "weekly"  . Sexual activity: Not Currently   Other Topics Concern  . Not on file   Social History Narrative  . No narrative on file    Family History:    Family History  Problem Relation Age of Onset  . CVA Father   . Other Brother        AIDS     ROS:  Please see the history of present illness.  ROS  All other ROS reviewed and negative.     Physical Exam/Data:   Vitals:   01/01/17 1804 01/01/17 1929 01/02/17 0005 01/02/17 0454  BP:  115/62 124/71 114/69  Pulse: 70 65 (!) 58 61  Resp:  18  18 18   Temp:  98.5 F (36.9 C) 97.7 F (36.5 C) 98 F (36.7 C)  TempSrc:  Oral Oral Oral  SpO2:  97% 96% 96%  Weight:    170 lb 4.8 oz (77.2 kg)  Height:        Intake/Output Summary (Last 24 hours) at 01/02/17 1009 Last data filed at 01/02/17 0943  Gross per 24 hour  Intake             1048 ml  Output             1800 ml  Net             -752 ml   Filed Weights   12/31/16 2035 01/01/17 0707 01/02/17 0454  Weight: 171 lb 8.6 oz (77.8 kg) 170 lb 3.1 oz (77.2 kg) 170 lb 4.8 oz (77.2 kg)   Body mass index is 23.75 kg/m.  General:  Well nourished, well developed, in no acute distress HEENT: normal Lymph: no adenopathy Neck: no JVD Endocrine:  No thryomegaly Vascular: No carotid bruits; FA pulses 2+ bilaterally without bruits  Cardiac:  normal S1, S2;  RRR; no murmur . He does have a very distinct systolic click, but no murmur can be elicited even with the Valsalva maneuver. The click is feet appear to become more prominent fofollowing Valsalva. Lungs:  clear to auscultation bilaterally, no wheezing, rhonchi or rales  Abd: soft, nontender, no hepatomegaly  Ext: no edema Musculoskeletal:  No deformities, BUE and BLE strength normal and equal Skin: warm and dry  Neuro:  CNs 2-12 intact, no focal abnormalities noted Psych:  Normal affect   EKG:  The EKG was personally reviewed and demonstrates:  Normal sinus rhythm without any repolarization abnormalities Telemetry:  Telemetry was personally reviewed and demonstrates:  Normal sinus rhythm  Relevant CV Studies: Reviewed echo from 2014  Laboratory Data:  Chemistry  Recent Labs Lab 12/31/16 1617  NA 139  K 3.9  CL 106  CO2 21*  GLUCOSE 131*  BUN 12  CREATININE 0.86  CALCIUM 9.5  GFRNONAA >60  GFRAA >60  ANIONGAP 12    No results for input(s): PROT, ALBUMIN, AST, ALT, ALKPHOS, BILITOT in the last 168 hours. Hematology  Recent Labs Lab 12/31/16 1617 01/01/17 0408 01/02/17 0323  WBC 7.3 6.3 6.4  RBC 5.25 4.99  4.98  HGB 17.1* 15.9 15.9  HCT 46.4 43.7 43.1  MCV 88.4 87.6 86.5  MCH 32.6 31.9 31.9  MCHC 36.9* 36.4* 36.9*  RDW 12.5 12.2 12.2  PLT 184 162 161   Cardiac Enzymes  Recent Labs Lab 12/31/16 2008 12/31/16 2226 01/01/17 0408 01/01/17 0919  TROPONINI <0.03 <0.03 <0.03 <0.03     Recent Labs Lab 12/31/16 1639  TROPIPOC 0.11*    Radiology/Studies:  Dg Chest 2 View  Result Date: 12/31/2016 CLINICAL DATA:  Chest pain. EXAM: CHEST  2 VIEW COMPARISON:  None. FINDINGS: The heart size and mediastinal contours are within normal limits. Both lungs are clear. The visualized skeletal structures are unremarkable. IMPRESSION: No active cardiopulmonary disease. Electronically Signed   By: Marin Roberts M.D.   On: 12/31/2016 16:47   Ct Coronary Morph W/cta Cor W/score W/ca W/cm &/or Wo/cm  Addendum Date: 01/01/2017   ADDENDUM REPORT: 01/01/2017 17:36 EXAM: OVER-READ INTERPRETATION  CT CHEST The following report is an over-read performed by radiologist Dr. [facility] on 01/01/2017. This over-read does not include interpretation of cardiac or coronary anatomy or pathology. The coronary calcium score and cardiac CTA interpretation by the cardiologist is attached. COMPARISON:  None. FINDINGS: 2 mm pulmonary nodule in the periphery of the left upper lobe (axial image 12 of series 12). Within the visualized portions of the thorax there are no larger more suspicious appearing pulmonary nodules or masses, there is no acute consolidative airspace disease, no pleural effusions, no pneumothorax and no lymphadenopathy. Visualized portions of the upper abdomen are unremarkable. There are no aggressive appearing lytic or blastic lesions noted in the visualized portions of the skeleton. IMPRESSION: 1. 2 mm pulmonary nodule in the periphery of the left upper lobe is highly nonspecific, but statistically likely benign. No follow-up needed if patient is low-risk. Non-contrast chest  CT can be considered in 12 months if patient is high-risk. This recommendation follows the consensus statement: Guidelines for Management of Incidental Pulmonary Nodules Detected on CT Images: From the Fleischner Society 2017; Radiology 2017; 284:228-243. Electronically Signed   By: Trudie Reed M.D.   On: 01/01/2017 17:36   Result Date: 01/01/2017 CLINICAL DATA:  89 -year-old male with chest pain and multiple risk factors including DM, HTN and HLP. EXAM: Cardiac/Coronary  CT TECHNIQUE: The patient was scanned on a [employer] scanner. FINDINGS: A 120 kV prospective scan was triggered in the descending thoracic aorta at 111 HU's. Axial non-contrast 3 mm slices were carried out through the heart. The data set was analyzed on a dedicated work station and scored using the Agatson method. Gantry rotation speed was 250 msecs and collimation was .6 mm. No beta blockade and 0.8 mg of sl NTG was given. The 3D data set was reconstructed in 5% intervals of the 67-82 % of the R-R cycle. Diastolic phases were analyzed on a dedicated work station using MPR, MIP and VRT modes. The patient received 80 cc of contrast. Aorta:  Normal size.  No calcifications.  No dissection. Aortic Valve:  Trileaflet.  No calcifications. Coronary Arteries:  Normal coronary origin.  Right dominance. RCA is a large dominant artery that gives rise to PDA and PLVB. There moderate to severe diffuse mixed, predominantly calcified plaque in the mid RCA associated with 50-69% stenosis but possibly > 70% stenosis. There is another tandem lesion in the mid RCA with moderate but possibly severe stenosis. Left main is a large artery that gives rise to LAD, ramus intermedius and LCX arteries. LM has minimal calcified plaque with associated stenosis 0-25% LAD is a large vessel that gives rise to two diagonal branches. LAD has mild eccentric diffuse calcified plaque in the proximal and mid portion with associated stenosis 25-50%. RI is a very small artery  that has no plaque. LCX is a non-dominant artery that gives rise to one OM1 branch. There is no significant plaque. Other findings: Normal pulmonary vein drainage into the left atrium. Normal let atrial appendage without a thrombus. 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 IMPRESSION: 1. Coronary calcium score of 618. This was 6684 percentile for age and sex matched control. 2. Normal coronary origin with right dominance. 3. 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 We will obtain additional CT FFR analysis. 4. 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 Tobias AlexanderKatarina Satoria Dunlop Electronically Signed: By: Tobias AlexanderKatarina  Yuki Brunsman On: 01/01/2017 10:58     Assessment and Plan:   Chest pain: Symptoms could be consistent with an acute coronary event and he does have coronary risk factors (diabetes mellitus, male gender, age) but the EKG is remarkably normal and the increase in cardiac troponin is very mild. He is hemodynamically well compensated and is now asymptomatic. Will keep on intravenous heparin for now. Recheck troponins all negative. Coronary CT showed:.Coronary calcium score of 618. This was 4084 percentile for age and sex matched control. Normal coronary origin with right dominance. 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 However CT FFR was 0.88 in RCA. We will plan for an aggressive medical therapy, if ongoing exertional chest pain, possibly a cath in the future. The patient was not on statin till now. We will continue carvedilol, ASA, atorvastatin.   DM: On invokanna monotherapy. SSI. Check lipid profile. Start high intensity statin, dose to be tailored depending on results of further workup  MVP: Unsure about the accuracy of this diagnosis, we will await echo and discharge home if overall normal.  Signed, Tobias AlexanderKatarina Marquavius Scaife, MD  01/02/2017 10:09 AM

## 2017-01-02 NOTE — Progress Notes (Signed)
  Echocardiogram 2D Echocardiogram has been performed.  Matthew Lozano 01/02/2017, 2:25 PM

## 2017-01-02 NOTE — Discharge Summary (Signed)
Discharge Summary    Patient ID: Matthew EvensDrew Bodey,  MRN: [id number], DOB/AGE: 66/07/1950 66 y.o.  Admit date: 12/31/2016 Discharge date: 01/02/2017  Primary Care Provider: Catha GosselinLittle, Kevin Primary Cardiologist: New - Dr. Royann Shiversroitoru  Discharge Diagnoses    Principal Problem:   Midsternal chest pain Active Problems:   Diabetes mellitus (HCC)   Type II diabetes mellitus (HCC)   History of Present Illness     Matthew Lozano is a 66 y.o. male with past medical history of MVP and Type 2 DM who presented to [facility] on 12/[facility]/2016 for evaluation of chest pain.   He began experiencing chest pressure along his retrosternal region the night prior to his presentation. He did not notice any factors that worsened or attenuated the discomfort. He managed to sleep through the night, but the chest discomfort was still present when he woke from sleep the following morning, therefore he decided to proceed to the ED for further evaluation.   His initial I-STAT troponin was mildly elevated at 0.11, therefore he was started on Heparin and admitted for further observation.  Hospital Course     Consultants: None  The following morning, he denied any recurrent chest pain. His cyclic troponin values remained negative. Lipid Panel showed a total cholesterol of 146 with LDL at 82. Hgb A1c was elevated to 7.7. With enzymes remaining negative, the decision was made to proceed with a Coronary CTA/FFR. This showed a coronary calcium score of 618 with mild CAD in the LAD and at least moderate diffuse CAD along the RCA, therefore CT FFR was obtained and not significant (at 0.88 along the RCA). A 2 mm pulmonary nodule was noted and he will need repeat imaging in 12 months.  He was examined on 01/02/2017 and denied any recurrent symptoms. Due to his history of MVP, an echocardiogram was obtained and showed normal LV function with grade 1 diastolic dysfunction. With his CT results, aggressive medical management was  recommended and if he continues to experience exertional pain as an outpatient, then to proceed with a cardiac catheterization. He will need to follow-up with his PCP in regards to his Type 2 DM.  He was last examined by Dr. Delton SeeNelson and deemed stable for discharge. 2-week Cardiology follow-up has been arranged. He was discharged home in stable condition.   _____________  Discharge Vitals Blood pressure 124/67, pulse 71, temperature 98.4 F (36.9 C), temperature source Oral, resp. rate 20, height 5\' 11"  (1.803 m), weight 170 lb 4.8 oz (77.2 kg), SpO2 97 %.  Filed Weights   12/31/16 2035 01/01/17 0707 01/02/17 0454  Weight: 171 lb 8.6 oz (77.8 kg) 170 lb 3.1 oz (77.2 kg) 170 lb 4.8 oz (77.2 kg)    Labs & Radiologic Studies     CBC  Recent Labs  01/01/17 0408 01/02/17 0323  WBC 6.3 6.4  HGB 15.9 15.9  HCT 43.7 43.1  MCV 87.6 86.5  PLT 162 161   Basic Metabolic Panel  Recent Labs  12/31/16 1617  NA 139  K 3.9  CL 106  CO2 21*  GLUCOSE 131*  BUN 12  CREATININE 0.86  CALCIUM 9.5   Cardiac Enzymes  Recent Labs  12/31/16 2226 01/01/17 0408 01/01/17 0919  TROPONINI <0.03 <0.03 <0.03   Hemoglobin A1C  Recent Labs  01/01/17 0408  HGBA1C 7.7*   Fasting Lipid Panel  Recent Labs  01/01/17 0408  CHOL 146  HDL 50  LDLCALC 82  TRIG 69  CHOLHDL 2.9  Dg Chest 2 View  Result Date: 12/31/2016 CLINICAL DATA:  Chest pain. EXAM: CHEST  2 VIEW COMPARISON:  None. FINDINGS: The heart size and mediastinal contours are within normal limits. Both lungs are clear. The visualized skeletal structures are unremarkable. IMPRESSION: No active cardiopulmonary disease. Electronically Signed   By: Marin Roberts M.D.   On: 12/31/2016 16:47   Ct Coronary Morph W/cta Cor W/score W/ca W/cm &/or Wo/cm  Addendum Date: 01/01/2017   ADDENDUM REPORT: 01/01/2017 17:36 EXAM: OVER-READ INTERPRETATION  CT CHEST The following report is an over-read performed by radiologist Dr. Royal Piedra [facility] on 01/01/2017. This over-read does not include interpretation of cardiac or coronary anatomy or pathology. The coronary calcium score and cardiac CTA interpretation by the cardiologist is attached. COMPARISON:  None. FINDINGS: 2 mm pulmonary nodule in the periphery of the left upper lobe (axial image 12 of series 12). Within the visualized portions of the thorax there are no larger more suspicious appearing pulmonary nodules or masses, there is no acute consolidative airspace disease, no pleural effusions, no pneumothorax and no lymphadenopathy. Visualized portions of the upper abdomen are unremarkable. There are no aggressive appearing lytic or blastic lesions noted in the visualized portions of the skeleton. IMPRESSION: 1. 2 mm pulmonary nodule in the periphery of the left upper lobe is highly nonspecific, but statistically likely benign. No follow-up needed if patient is low-risk. Non-contrast chest CT can be considered in 12 months if patient is high-risk. This recommendation follows the consensus statement: Guidelines for Management of Incidental Pulmonary Nodules Detected on CT Images: From the Fleischner Society 2017; Radiology 2017; 284:228-243. Electronically Signed   By: Trudie Reed M.D.   On: 01/01/2017 17:36   Result Date: 01/01/2017 CLINICAL DATA:  40 -year-old male with chest pain and multiple risk factors including DM, HTN and HLP. EXAM: Cardiac/Coronary  CT TECHNIQUE: The patient was scanned on a [employer]. FINDINGS: A 120 kV prospective scan was triggered in the descending thoracic aorta at 111 HU's. Axial non-contrast 3 mm slices were carried out through the heart. The data set was analyzed on a dedicated work station and scored using the Agatson method. Gantry rotation speed was 250 msecs and collimation was .6 mm. No beta blockade and 0.8 mg of sl NTG was given. The 3D data set was reconstructed in 5% intervals of the 67-82 % of the R-R  cycle. Diastolic phases were analyzed on a dedicated work station using MPR, MIP and VRT modes. The patient received 80 cc of contrast. Aorta:  Normal size.  No calcifications.  No dissection. Aortic Valve:  Trileaflet.  No calcifications. Coronary Arteries:  Normal coronary origin.  Right dominance. RCA is a large dominant artery that gives rise to PDA and PLVB. There moderate to severe diffuse mixed, predominantly calcified plaque in the mid RCA associated with 50-69% stenosis but possibly > 70% stenosis. There is another tandem lesion in the mid RCA with moderate but possibly severe stenosis. Left main is a large artery that gives rise to LAD, ramus intermedius and LCX arteries. LM has minimal calcified plaque with associated stenosis 0-25% LAD is a large vessel that gives rise to two diagonal branches. LAD has mild eccentric diffuse calcified plaque in the proximal and mid portion with associated stenosis 25-50%. RI is a very small artery that has no plaque. LCX is a non-dominant artery that gives rise to one OM1 branch. There is no significant plaque. Other findings: Normal pulmonary vein drainage into the  left atrium. Normal let atrial appendage without a thrombus. 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 IMPRESSION: 1. Coronary calcium score of 618. This was 85 percentile for age and sex matched control. 2. Normal coronary origin with right dominance. 3. 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 We will obtain additional CT FFR analysis. 4. 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 Tobias Alexander Electronically Signed: By: Tobias Alexander On: 01/01/2017 10:58   Ct Coronary Fractional Flow Reserve Fluid Analysis  Result Date: 01/02/2017 EXAM: FF/RCT ANALYSIS FINDINGS: FFRct analysis was performed on the original cardiac CT angiogram dataset. Diagrammatic representation of the FFRct analysis is provided in a separate PDF document in PACS. This dictation was  created using the PDF document and an interactive 3D model of the results. 3D model is not available in the EMR/PACS. Normal FFR range is >0.80. 1. Left Main:  No significant stenosis. 2. LAD: No significant stenosis.  FFR 0.89. 3. LCX: No significant stenosis.  FFR 0.94. 4. RCA: No significant stenosis.  FFR 0.88. IMPRESSION: 1.  CT FFR analysis didn't show any significant stenosis. Tobias Alexander Electronically Signed   By: Tobias Alexander   On: 01/02/2017 13:16     Diagnostic Studies/Procedures    Echocardiogram: 8.12.2018 Study Conclusions   - Left ventricle: The cavity size was normal. There was mild   concentric hypertrophy. Systolic function was normal. The   estimated ejection fraction was in the range of 55% to 60%. Wall   motion was normal; there were no regional wall motion   abnormalities. Doppler parameters are consistent with abnormal   left ventricular relaxation (grade 1 diastolic dysfunction). - Mitral valve: Mildly calcified annulus. Valve area by pressure   half-time: 2.08 cm^2. - Pericardium, extracardiac: A trivial pericardial effusion was   identified posterior to the heart.  _____________    Disposition   Pt is being discharged home today in good condition.  Follow-up Plans & Appointments    Follow-up Information    Abelino Derrick, PA-C Follow up on 01/21/2017.   Specialties:  Cardiology, Radiology Why:  Cardiology Hospital Follow-Up on 01/21/2017 at 9:00AM with Corine Shelter, PA-C (works with Dr. Royann Shivers) Contact information: [address] [place] [address] [phone]           Discharge Medications    Allergies as of 01/02/2017      Reactions   Codeine Other (See Comments)   Reaction:  GI upset    Metformin And Related Diarrhea   Phenergan [promethazine Hcl] Diarrhea   Androgel [testosterone] Rash      Medication List    TAKE these medications   aspirin 81 MG EC tablet Take 1 tablet (81 mg total) by mouth daily.     atorvastatin 80 MG tablet Commonly known as:  LIPITOR Take 1 tablet (80 mg total) by mouth daily at 6 PM.   carvedilol 3.125 MG tablet Commonly known as:  COREG Take 1 tablet (3.125 mg total) by mouth 2 (two) times daily with a meal.   dicyclomine 10 MG capsule Commonly known as:  BENTYL Take 2 capsules (20 mg total) by mouth 4 (four) times daily as needed for spasms.   famotidine 20 MG tablet Commonly known as:  PEPCID Take 1 tablet (20 mg total) by mouth 2 (two) times daily.   INVOKANA 300 MG Tabs tablet Generic drug:  canagliflozin Take 300 mg by mouth daily before breakfast.   lidocaine 2 %  solution Commonly known as:  XYLOCAINE Use as directed 15 mLs in the mouth or throat every 3 (three) hours as needed.   nitroGLYCERIN 0.4 MG SL tablet Commonly known as:  NITROSTAT Place 1 tablet (0.4 mg total) under the tongue every 5 (five) minutes x 3 doses as needed for chest pain.   ondansetron 4 MG tablet Commonly known as:  ZOFRAN Take 1 tablet (4 mg total) by mouth every 8 (eight) hours as needed for nausea or vomiting.       Outstanding Labs/Studies   FLP/LFT's in 6 weeks.   Duration of Discharge Encounter   Greater than 30 minutes including physician time.  Signed, Nicolasa Ducking, NP 01/02/2017, 4:57 PM

## 2017-01-02 NOTE — Progress Notes (Signed)
ANTICOAGULATION FOLLOW UP NOTE  Pharmacy Consult for heparin Indication: chest pain/ACS  Heparin Dosing Weight: 78 kg  Assessment: 66 yom presenting with chest pain. Pharmacy consulted to dose heparin for ACS. Not on anticoagulation PTA. CBC within normal limits, hemoglobin and platelets are stable. No bleed documented. Heparin level this morning was therapeutic at 0.43.   Goal of Therapy:  Heparin level 0.3-0.7 units/ml Monitor platelets by anticoagulation protocol: Yes   Plan:  Continue heparin drip at 1100 units/ hours Daily heparin level/CBC Monitor s/sx bleeding  Matthew Lozano, Pharm.D. PGY1 Pharmacy Resident 01/02/2017 10:12 AM Main Pharmacy: [phone]

## 2017-01-05 DIAGNOSIS — Z79899 Other long term (current) drug therapy: Secondary | ICD-10-CM | POA: Diagnosis not present

## 2017-01-05 DIAGNOSIS — I2510 Atherosclerotic heart disease of native coronary artery without angina pectoris: Secondary | ICD-10-CM | POA: Diagnosis not present

## 2017-01-05 DIAGNOSIS — E7800 Pure hypercholesterolemia, unspecified: Secondary | ICD-10-CM | POA: Diagnosis not present

## 2017-01-05 DIAGNOSIS — Z125 Encounter for screening for malignant neoplasm of prostate: Secondary | ICD-10-CM | POA: Diagnosis not present

## 2017-01-05 DIAGNOSIS — E119 Type 2 diabetes mellitus without complications: Secondary | ICD-10-CM | POA: Diagnosis not present

## 2017-01-06 ENCOUNTER — Telehealth: Payer: Self-pay | Admitting: *Deleted

## 2017-01-06 NOTE — Telephone Encounter (Signed)
Dr Duke Salviaandolph spoke with Dr Clarene DukeLittle regarding patient and chest pain on 01/05/17. She recommended STAT Troponin and if abnormal go to ED, if not see sooner than 01/21/17. Did schedule with Herma CarsonMichelle Lenze at [facility] 01/10/17 secondary to no availabilities at [facility]. Patient aware of date, time, and location

## 2017-01-07 LAB — GLUCOSE, CAPILLARY: Glucose-Capillary: 188 mg/dL — ABNORMAL HIGH (ref 65–99)

## 2017-01-10 ENCOUNTER — Encounter: Payer: Self-pay | Admitting: Physician Assistant

## 2017-01-10 ENCOUNTER — Ambulatory Visit (INDEPENDENT_AMBULATORY_CARE_PROVIDER_SITE_OTHER): Payer: Medicare Other | Admitting: Physician Assistant

## 2017-01-10 VITALS — BP 134/78 | HR 72 | Ht 71.0 in | Wt 173.0 lb

## 2017-01-10 DIAGNOSIS — I341 Nonrheumatic mitral (valve) prolapse: Secondary | ICD-10-CM | POA: Diagnosis not present

## 2017-01-10 DIAGNOSIS — E119 Type 2 diabetes mellitus without complications: Secondary | ICD-10-CM | POA: Diagnosis not present

## 2017-01-10 DIAGNOSIS — I2510 Atherosclerotic heart disease of native coronary artery without angina pectoris: Secondary | ICD-10-CM

## 2017-01-10 DIAGNOSIS — R0789 Other chest pain: Secondary | ICD-10-CM | POA: Diagnosis not present

## 2017-01-10 DIAGNOSIS — N529 Male erectile dysfunction, unspecified: Secondary | ICD-10-CM | POA: Insufficient documentation

## 2017-01-10 DIAGNOSIS — E291 Testicular hypofunction: Secondary | ICD-10-CM | POA: Insufficient documentation

## 2017-01-10 NOTE — Addendum Note (Signed)
Addended by: Etheleen Mayhew C on: 01/10/2017 10:15 AM   Modules accepted: Orders

## 2017-01-10 NOTE — Progress Notes (Signed)
Cardiology Office Note    Date:  01/10/2017   ID:  Matthew Lozano, DOB [birth year], MRN [id number]  PCP:  Catha Gosselin, MD  Cardiologist: Dr. Royann Shivers  Chief Complaint  Patient presents with  . Follow-up    History of Present Illness:  Matthew Lozano is a 66 y.o. male history of MVP, type II DM. He was admitted to the hospital 12/31/16 with chest pain and initial troponin of 0.1. Follow-up troponins were negative lipid panel cholesterol 146 LDL 82, hemoglobin A1c was elevated at 7.7. He had coronary CTA/FFR which showed a Coronary calcium score of 618 with mild CAD in the LAD and at least moderate diffuse CAD along the RCA. FFR was obtained and not significant at 0.88 along the RCA. A 2 mm pulmonary nodule was noted and he will need repeat imaging in 12 months. 2-D echo showed normal LV function with grade 1 DD. Aggressive medical management was recommended and if he continues to experience exertional chest pain as an outpatient and then proceed with cardiac catheterization.  Patient called into our office 01/06/17 with recurrent chest pain. It was recommended that he have a stat troponin done and if abnormal go to the ER.This was drawn at Dr. Fredirick Maudlin office and I don't have the results.  Patient comes in today still having chest pain. He describes it as a tightness or pressure that occurs at any time. He's been taking Nexium. He also thinks anxiety may be playing a role. He started walking and has no change in his symptoms. He did not take the Lipitor or Coreg because he says his cholesterol isn't high and he doesn't have hypertension. He says he wants to have a cardiac catheterization to have a definitive diagnosis.  Past Medical History:  Diagnosis Date  . GERD (gastroesophageal reflux disease)   . Hemorrhoids   . MVP (mitral valve prolapse)   . Type II diabetes mellitus (HCC) dx'd 2008  . Viral meningitis 2001    Past Surgical History:  Procedure Laterality Date  . LIPOMA EXCISION   2001   OFF BACK     Current Medications: Current Meds  Medication Sig  . aspirin 81 MG EC tablet Take 1 tablet (81 mg total) by mouth daily.  . canagliflozin (INVOKANA) 300 MG TABS tablet Take 300 mg by mouth daily before breakfast.     Allergies:   Codeine; Metformin and related; Phenergan [promethazine hcl]; and Androgel [testosterone]   Social History   Social History  . Marital status: Divorced    Spouse name: N/A  . Number of children: N/A  . Years of education: N/A   Social History Main Topics  . Smoking status: Former Smoker    Packs/day: 0.50    Years: 10.00    Types: Cigarettes    Quit date: 51  . Smokeless tobacco: Never Used  . Alcohol use Yes     Comment: 12/31/2016 "stopped 11/2016"  . Drug use: Yes    Types: Marijuana     Comment: 12/31/2016 "weekly"  . Sexual activity: Not Currently   Other Topics Concern  . None   Social History Narrative  . None     Family History:  The patient's family history includes CVA in his father; Other in his brother.   ROS:   Please see the history of present illness.    Review of Systems  Constitution: Negative.  HENT: Negative.   Cardiovascular: Positive for chest pain.  Respiratory: Negative.   Endocrine: Negative.  Hematologic/Lymphatic: Bruises/bleeds easily.  Musculoskeletal: Negative.   Gastrointestinal: Positive for heartburn.  Genitourinary: Negative.   Neurological: Negative.   Psychiatric/Behavioral: The patient is nervous/anxious.    All other systems reviewed and are negative.   PHYSICAL EXAM:   VS:  BP 134/78   Pulse 72   Ht 5\' 11"  (1.803 m)   Wt 173 lb (78.5 kg)   SpO2 98%   BMI 24.13 kg/m   Physical Exam  GEN: Well nourished, well developed, in no acute distress  HEENT: normal  Neck: no JVD, carotid bruits, or masses Cardiac:RRR; Mid systolic click Respiratory:  clear to auscultation bilaterally, normal work of breathing GI: soft, nontender, nondistended, + BS Ext: without cyanosis,  clubbing, or edema, Good distal pulses bilaterally MS: no deformity or atrophy  Skin: warm and dry, no rash Neuro:  Alert and Oriented x 3 Psych: euthymic mood, full affect  Wt Readings from Last 3 Encounters:  01/10/17 173 lb (78.5 kg)  01/02/17 170 lb 4.8 oz (77.2 kg)  07/01/16 180 lb (81.6 kg)      Studies/Labs Reviewed:   EKG:  EKG is  ordered today.  The ekg ordered today demonstrates Normal sinus rhythm with sinus arrhythmia, no acute change  Recent Labs: 07/01/2016: ALT 25 12/31/2016: BUN 12; Creatinine, Ser 0.86; Potassium 3.9; Sodium 139 01/02/2017: Hemoglobin 15.9; Platelets 161   Lipid Panel    Component Value Date/Time   CHOL 146 01/01/2017 0408   TRIG 69 01/01/2017 0408   HDL 50 01/01/2017 0408   CHOLHDL 2.9 01/01/2017 0408   VLDL 14 01/01/2017 0408   LDLCALC 82 01/01/2017 0408    Additional studies/ records that were reviewed today include:  CT a/FFR 01/02/17 IMPRESSION: 1. Coronary calcium score of 618. This was 60 percentile for age and sex matched control.   2. Normal coronary origin with right dominance.   3. 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 We will obtain additional CT FFR analysis.   4. 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   Matthew Lozano   Electronically Signed: By: Matthew Lozano On: 01/01/2017 10:58   FINDINGS: FFRct analysis was performed on the original cardiac CT angiogram dataset. Diagrammatic representation of the FFRct analysis is provided in a separate PDF document in PACS. This dictation was created using the PDF document and an interactive 3D model of the results. 3D model is not available in the EMR/PACS. Normal FFR range is >0.80.   1. Left Main:  No significant stenosis.   2. LAD: No significant stenosis.  FFR 0.89. 3. LCX: No significant stenosis.  FFR 0.94. 4. RCA: No significant stenosis.  FFR 0.88.   IMPRESSION: 1.  CT FFR analysis didn't show any significant  stenosis.   IMPRESSION: 1. 2 mm pulmonary nodule in the periphery of the left upper lobe is highly nonspecific, but statistically likely benign. No follow-up needed if patient is low-risk. Non-contrast chest CT can be considered in 12 months if patient is high-risk. This recommendation follows the consensus statement: Guidelines for Management of Incidental Pulmonary Nodules Detected on CT Images: From the Fleischner Society 2017; Radiology 2017; 284:228-243.     Echocardiogram: 8.12.2018 Study Conclusions   - Left ventricle: The cavity size was normal. There was mild   concentric hypertrophy. Systolic function was normal. The   estimated ejection fraction was in the range of 55% to 60%. Wall   motion was normal; there were no regional wall motion  abnormalities. Doppler parameters are consistent with abnormal   left ventricular relaxation (grade 1 diastolic dysfunction). - Mitral valve: Mildly calcified annulus. Valve area by pressure   half-time: 2.08 cm^2. - Pericardium, extracardiac: A trivial pericardial effusion was   identified posterior to the heart.      ASSESSMENT:    1. Midsternal chest pain   2. CAD in native artery   3. MVP (mitral valve prolapse)   4. Type 2 diabetes mellitus without complication, without long-term current use of insulin (HCC)      PLAN:  In order of problems listed above:  Chest pain patient continues to have chest tightness and pressure. We'll schedule cardiac catheterization for definitive active diagnosis. Discussed with Dr.Ross who concurs.  I have reviewed the risks, indications, and alternatives to angioplasty and stenting with the patient. Risks include but are not limited to bleeding, infection, vascular injury, stroke, myocardial infection, arrhythmia, kidney injury, radiation-related injury in the case of prolonged fluoroscopy use, emergency cardiac surgery, and death. The patient understands the risks of serious complication is  low (<1%) and he agrees to proceed.     CAD 1 troponin 0.11 others -01/02/17 mild in the LAD and moderate diffuse in the mid RCA on CTA FFR 0.88 in the RCA no significant stenosis. Recommend he start Lipitor.  MVP  Diabetes mellitus2 recently started on Actos. Managed by primary care.    Medication Adjustments/Labs and Tests Ordered: Current medicines are reviewed at length with the patient today.  Concerns regarding medicines are outlined above.  Medication changes, Labs and Tests ordered today are listed in the Patient Instructions below. Patient Instructions  Medication Instructions:  Your physician recommends that you continue on your current medications as directed. Please refer to the Current Medication list given to you today.   Labwork: Your physician recommends that you return for lab work today for pre-procedure work CBC, BMET, PT/INR  Testing/Procedures:    [facility] CARDIOVASCULAR DIVISION [facility] ST OFFICE [address], [address] [place] [address] Dept: [phone] Loc: [phone]  Matthew Lozano  01/10/2017  You are scheduled for a left heart cath on 01/11/17, with Dr. [place].  1. Please arrive at the [facility] (Main Entrance A) at [facility]: [address] [place], [place] [address] at 11:30 (two hours before your procedure to ensure your preparation). Free valet parking service is available.   Special note: Every effort is made to have your procedure done on time. Please understand that emergencies sometimes delay scheduled procedures.  2. Diet: nothing to eat or drink after midnight 01/10/17  3. Labs: CBC, BMET, PT/INR 01/10/17  4. Medication instructions in preparation for your procedure: please hold on taking your diabetes medication. You can take the rest with a sip of water including your aspirin.       5. Plan for one night stay--bring personal belongings. 6. Bring a current list of  your medications and current insurance cards. 7. You MUST have a responsible person to drive you home. 8. Someone MUST be with you the first 24 hours after you arrive home or your discharge will be delayed. 9. Please wear clothes that are easy to get on and off and wear slip-on shoes.  Thank you for allowing [place] to care for you!   -- [facility]   Any Other Special Instructions Will Be Listed Below (If Applicable).     If you need a refill on your cardiac  medications before your next appointment, please call your pharmacy.      Matthew Clan, PA-C  01/10/2017 10:04 AM    [facility] [address] [place], [place], [place]  [address] Phone: [phone]; Fax: [phone]

## 2017-01-10 NOTE — Patient Instructions (Addendum)
Medication Instructions:  Your physician recommends that you continue on your current medications as directed. Please refer to the Current Medication list given to you today.   Labwork: Your physician recommends that you return for lab work today for pre-procedure work CBC, BMET, PT/INR  Testing/Procedures:    [facility] CARDIOVASCULAR DIVISION [facility] ST OFFICE [address], [address] [place] [address] Dept: [phone] Loc: [phone]  Toree Dillahunt  01/10/2017  You are scheduled for a left heart cath on 01/11/17, with Dr. Swaziland.  1. Please arrive at the [facility] (Main Entrance A) at [facility]: [address] [place], [place] [address] at 11:30 (two hours before your procedure to ensure your preparation). Free valet parking service is available.   Special note: Every effort is made to have your procedure done on time. Please understand that emergencies sometimes delay scheduled procedures.  2. Diet: nothing to eat or drink after midnight 01/10/17  3. Labs: CBC, BMET, PT/INR 01/10/17  4. Medication instructions in preparation for your procedure: please hold on taking your diabetes medication. You can take the rest with a sip of water including your aspirin.       5. Plan for one night stay--bring personal belongings. 6. Bring a current list of your medications and current insurance cards. 7. You MUST have a responsible person to drive you home. 8. Someone MUST be with you the first 24 hours after you arrive home or your discharge will be delayed. 9. Please wear clothes that are easy to get on and off and wear slip-on shoes.  Thank you for allowing [place] to care for you!   -- [facility]   Any Other Special Instructions Will Be Listed Below (If Applicable).     If you need a refill on your cardiac medications before your next appointment, please call your pharmacy.

## 2017-01-11 ENCOUNTER — Ambulatory Visit (HOSPITAL_COMMUNITY)
Admission: RE | Admit: 2017-01-11 | Discharge: 2017-01-11 | Disposition: A | Discharge status: Home / Self Care | Payer: Medicare Other | Source: Ambulatory Visit | Attending: Cardiology | Admitting: Cardiology

## 2017-01-11 ENCOUNTER — Encounter (HOSPITAL_COMMUNITY)
Admission: RE | Disposition: A | Discharge status: Home / Self Care | Payer: Self-pay | Source: Ambulatory Visit | Attending: Cardiology

## 2017-01-11 DIAGNOSIS — E119 Type 2 diabetes mellitus without complications: Secondary | ICD-10-CM | POA: Diagnosis not present

## 2017-01-11 DIAGNOSIS — I341 Nonrheumatic mitral (valve) prolapse: Secondary | ICD-10-CM | POA: Diagnosis not present

## 2017-01-11 DIAGNOSIS — R0789 Other chest pain: Secondary | ICD-10-CM

## 2017-01-11 DIAGNOSIS — F1290 Cannabis use, unspecified, uncomplicated: Secondary | ICD-10-CM | POA: Diagnosis not present

## 2017-01-11 DIAGNOSIS — R918 Other nonspecific abnormal finding of lung field: Secondary | ICD-10-CM | POA: Diagnosis not present

## 2017-01-11 DIAGNOSIS — K219 Gastro-esophageal reflux disease without esophagitis: Secondary | ICD-10-CM | POA: Insufficient documentation

## 2017-01-11 DIAGNOSIS — I2584 Coronary atherosclerosis due to calcified coronary lesion: Secondary | ICD-10-CM | POA: Insufficient documentation

## 2017-01-11 DIAGNOSIS — Z7984 Long term (current) use of oral hypoglycemic drugs: Secondary | ICD-10-CM | POA: Insufficient documentation

## 2017-01-11 DIAGNOSIS — Z87891 Personal history of nicotine dependence: Secondary | ICD-10-CM | POA: Insufficient documentation

## 2017-01-11 DIAGNOSIS — I2510 Atherosclerotic heart disease of native coronary artery without angina pectoris: Secondary | ICD-10-CM | POA: Diagnosis not present

## 2017-01-11 DIAGNOSIS — Z7982 Long term (current) use of aspirin: Secondary | ICD-10-CM | POA: Diagnosis not present

## 2017-01-11 DIAGNOSIS — E1165 Type 2 diabetes mellitus with hyperglycemia: Secondary | ICD-10-CM

## 2017-01-11 DIAGNOSIS — R079 Chest pain, unspecified: Secondary | ICD-10-CM | POA: Diagnosis present

## 2017-01-11 HISTORY — PX: LEFT HEART CATH AND CORONARY ANGIOGRAPHY: CATH118249

## 2017-01-11 LAB — CBC
Hematocrit: 42.5 % (ref 37.5–51.0)
Hemoglobin: 15.6 g/dL (ref 13.0–17.7)
MCH: 32.9 pg (ref 26.6–33.0)
MCHC: 36.7 g/dL — ABNORMAL HIGH (ref 31.5–35.7)
MCV: 90 fL (ref 79–97)
Platelets: 173 10*3/uL (ref 150–379)
RBC: 4.74 x10E6/uL (ref 4.14–5.80)
RDW: 11.7 % — ABNORMAL LOW (ref 12.3–15.4)
WBC: 6.8 10*3/uL (ref 3.4–10.8)

## 2017-01-11 LAB — PROTIME-INR
INR: 1 (ref 0.8–1.2)
Prothrombin Time: 10.6 s (ref 9.1–12.0)

## 2017-01-11 LAB — BASIC METABOLIC PANEL
BUN/Creatinine Ratio: 21 (ref 10–24)
BUN: 16 mg/dL (ref 8–27)
CO2: 18 mmol/L — ABNORMAL LOW (ref 20–29)
Calcium: 9.2 mg/dL (ref 8.6–10.2)
Chloride: 105 mmol/L (ref 96–106)
Creatinine, Ser: 0.78 mg/dL (ref 0.76–1.27)
GFR calc Af Amer: 109 mL/min/{1.73_m2} (ref >59)
GFR calc non Af Amer: 94 mL/min/{1.73_m2} (ref >59)
Glucose: 143 mg/dL — ABNORMAL HIGH (ref 65–99)
Potassium: 3.9 mmol/L (ref 3.5–5.2)
Sodium: 140 mmol/L (ref 134–144)

## 2017-01-11 LAB — GLUCOSE, CAPILLARY: Glucose-Capillary: 158 mg/dL — ABNORMAL HIGH (ref 65–99)

## 2017-01-11 SURGERY — LEFT HEART CATH AND CORONARY ANGIOGRAPHY
Anesthesia: LOCAL

## 2017-01-11 MED ORDER — MIDAZOLAM HCL 2 MG/2ML IJ SOLN
INTRAMUSCULAR | Status: DC | PRN
  Administered 2017-01-11: 2 mg via INTRAVENOUS

## 2017-01-11 MED ORDER — SODIUM CHLORIDE 0.9% FLUSH: 3.0000 mL | Freq: Two times a day (BID) | INTRAVENOUS | Status: DC

## 2017-01-11 MED ORDER — MIDAZOLAM HCL 2 MG/2ML IJ SOLN
INTRAMUSCULAR | Status: AC
  Filled 2017-01-11: qty 2

## 2017-01-11 MED ORDER — IOPAMIDOL (ISOVUE-370) INJECTION 76%
INTRAVENOUS | Status: DC | PRN
  Administered 2017-01-11: 95 mL via INTRAVENOUS

## 2017-01-11 MED ORDER — HEPARIN (PORCINE) IN NACL 2-0.9 UNIT/ML-% IJ SOLN
INTRAMUSCULAR | Status: AC
  Filled 2017-01-11: qty 500

## 2017-01-11 MED ORDER — FENTANYL CITRATE (PF) 100 MCG/2ML IJ SOLN
INTRAMUSCULAR | Status: AC
  Filled 2017-01-11: qty 2

## 2017-01-11 MED ORDER — LIDOCAINE HCL (PF) 1 % IJ SOLN
INTRAMUSCULAR | Status: DC | PRN
  Administered 2017-01-11: 2 mL

## 2017-01-11 MED ORDER — SODIUM CHLORIDE 0.9 % IV SOLN: 250.0000 mL | INTRAVENOUS | Status: DC | PRN

## 2017-01-11 MED ORDER — SODIUM CHLORIDE 0.9 % WEIGHT BASED INFUSION: 1.0000 mL/kg/h | INTRAVENOUS | Status: AC

## 2017-01-11 MED ORDER — SODIUM CHLORIDE 0.9% FLUSH: 3.0000 mL | INTRAVENOUS | Status: DC | PRN

## 2017-01-11 MED ORDER — IOPAMIDOL (ISOVUE-370) INJECTION 76%
INTRAVENOUS | Status: AC
  Filled 2017-01-11: qty 100

## 2017-01-11 MED ORDER — LIDOCAINE HCL (PF) 1 % IJ SOLN
INTRAMUSCULAR | Status: AC
  Filled 2017-01-11: qty 30

## 2017-01-11 MED ORDER — HEPARIN SODIUM (PORCINE) 1000 UNIT/ML IJ SOLN
INTRAMUSCULAR | Status: DC | PRN
  Administered 2017-01-11: 4000 [IU] via INTRAVENOUS

## 2017-01-11 MED ORDER — HEPARIN (PORCINE) IN NACL 2-0.9 UNIT/ML-% IJ SOLN
INTRAMUSCULAR | Status: AC | PRN
  Administered 2017-01-11: 1000 mL

## 2017-01-11 MED ORDER — FENTANYL CITRATE (PF) 100 MCG/2ML IJ SOLN
INTRAMUSCULAR | Status: DC | PRN
  Administered 2017-01-11: 25 ug via INTRAVENOUS

## 2017-01-11 MED ORDER — HEPARIN SODIUM (PORCINE) 1000 UNIT/ML IJ SOLN
INTRAMUSCULAR | Status: AC
  Filled 2017-01-11: qty 1

## 2017-01-11 MED ORDER — ASPIRIN 81 MG PO CHEW: 81.0000 mg | CHEWABLE_TABLET | ORAL | Status: DC

## 2017-01-11 MED ORDER — SODIUM CHLORIDE 0.9 % WEIGHT BASED INFUSION
3.0000 mL/kg/h | INTRAVENOUS | Status: AC
  Administered 2017-01-11: 3 mL/kg/h via INTRAVENOUS

## 2017-01-11 MED ORDER — SODIUM CHLORIDE 0.9 % WEIGHT BASED INFUSION: 1.0000 mL/kg/h | INTRAVENOUS | Status: DC

## 2017-01-11 MED ORDER — VERAPAMIL HCL 2.5 MG/ML IV SOLN
INTRAVENOUS | Status: DC | PRN
  Administered 2017-01-11: 10 mL via INTRA_ARTERIAL

## 2017-01-11 MED ORDER — VERAPAMIL HCL 2.5 MG/ML IV SOLN
INTRAVENOUS | Status: AC
  Filled 2017-01-11: qty 2

## 2017-01-11 SURGICAL SUPPLY — 10 items
CATH 5FR JL3.5 JR4 ANG PIG MP (CATHETERS) ×1 IMPLANT
DEVICE RAD COMP TR BAND LRG (VASCULAR PRODUCTS) ×1 IMPLANT
GLIDESHEATH SLEND SS 6F .021 (SHEATH) ×1 IMPLANT
GUIDEWIRE INQWIRE 1.5J.035X260 (WIRE) IMPLANT
INQWIRE 1.5J .035X260CM (WIRE) ×2
KIT HEART LEFT (KITS) ×2 IMPLANT
PACK CARDIAC CATHETERIZATION (CUSTOM PROCEDURE TRAY) ×2 IMPLANT
SYR MEDRAD MARK V 150ML (SYRINGE) ×2 IMPLANT
TRANSDUCER W/STOPCOCK (MISCELLANEOUS) ×2 IMPLANT
TUBING CIL FLEX 10 FLL-RA (TUBING) ×2 IMPLANT

## 2017-01-11 NOTE — H&P (View-Only) (Signed)
Cardiology Office Note    Date:  01/10/2017   ID:  Matthew Lozano, DOB [birth year], MRN [id number]  PCP:  Catha Gosselin, MD  Cardiologist: Dr. Royann Shivers  Chief Complaint  Patient presents with  . Follow-up    History of Present Illness:  Matthew Lozano is a 66 y.o. male history of MVP, type II DM. He was admitted to the hospital 12/31/16 with chest pain and initial troponin of 0.1. Follow-up troponins were negative lipid panel cholesterol 146 LDL 82, hemoglobin A1c was elevated at 7.7. He had coronary CTA/FFR which showed a Coronary calcium score of 618 with mild CAD in the LAD and at least moderate diffuse CAD along the RCA. FFR was obtained and not significant at 0.88 along the RCA. A 2 mm pulmonary nodule was noted and he will need repeat imaging in 12 months. 2-D echo showed normal LV function with grade 1 DD. Aggressive medical management was recommended and if he continues to experience exertional chest pain as an outpatient and then proceed with cardiac catheterization.  Patient called into our office 01/06/17 with recurrent chest pain. It was recommended that he have a stat troponin done and if abnormal go to the ER.This was drawn at Dr. Fredirick Maudlin office and I don't have the results.  Patient comes in today still having chest pain. He describes it as a tightness or pressure that occurs at any time. He's been taking Nexium. He also thinks anxiety may be playing a role. He started walking and has no change in his symptoms. He did not take the Lipitor or Coreg because he says his cholesterol isn't high and he doesn't have hypertension. He says he wants to have a cardiac catheterization to have a definitive diagnosis.  Past Medical History:  Diagnosis Date  . GERD (gastroesophageal reflux disease)   . Hemorrhoids   . MVP (mitral valve prolapse)   . Type II diabetes mellitus (HCC) dx'd 2008  . Viral meningitis 2001    Past Surgical History:  Procedure Laterality Date  . LIPOMA EXCISION   2001   OFF BACK     Current Medications: Current Meds  Medication Sig  . aspirin 81 MG EC tablet Take 1 tablet (81 mg total) by mouth daily.  . canagliflozin (INVOKANA) 300 MG TABS tablet Take 300 mg by mouth daily before breakfast.     Allergies:   Codeine; Metformin and related; Phenergan [promethazine hcl]; and Androgel [testosterone]   Social History   Social History  . Marital status: Divorced    Spouse name: N/A  . Number of children: N/A  . Years of education: N/A   Social History Main Topics  . Smoking status: Former Smoker    Packs/day: 0.50    Years: 10.00    Types: Cigarettes    Quit date: 51  . Smokeless tobacco: Never Used  . Alcohol use Yes     Comment: 12/31/2016 "stopped 11/2016"  . Drug use: Yes    Types: Marijuana     Comment: 12/31/2016 "weekly"  . Sexual activity: Not Currently   Other Topics Concern  . None   Social History Narrative  . None     Family History:  The patient's family history includes CVA in his father; Other in his brother.   ROS:   Please see the history of present illness.    Review of Systems  Constitution: Negative.  HENT: Negative.   Cardiovascular: Positive for chest pain.  Respiratory: Negative.   Endocrine: Negative.  Hematologic/Lymphatic: Bruises/bleeds easily.  Musculoskeletal: Negative.   Gastrointestinal: Positive for heartburn.  Genitourinary: Negative.   Neurological: Negative.   Psychiatric/Behavioral: The patient is nervous/anxious.    All other systems reviewed and are negative.   PHYSICAL EXAM:   VS:  BP 134/78   Pulse 72   Ht 5\' 11"  (1.803 m)   Wt 173 lb (78.5 kg)   SpO2 98%   BMI 24.13 kg/m   Physical Exam  GEN: Well nourished, well developed, in no acute distress  HEENT: normal  Neck: no JVD, carotid bruits, or masses Cardiac:RRR; Mid systolic click Respiratory:  clear to auscultation bilaterally, normal work of breathing GI: soft, nontender, nondistended, + BS Ext: without cyanosis,  clubbing, or edema, Good distal pulses bilaterally MS: no deformity or atrophy  Skin: warm and dry, no rash Neuro:  Alert and Oriented x 3 Psych: euthymic mood, full affect  Wt Readings from Last 3 Encounters:  01/10/17 173 lb (78.5 kg)  01/02/17 170 lb 4.8 oz (77.2 kg)  07/01/16 180 lb (81.6 kg)      Studies/Labs Reviewed:   EKG:  EKG is  ordered today.  The ekg ordered today demonstrates Normal sinus rhythm with sinus arrhythmia, no acute change  Recent Labs: 07/01/2016: ALT 25 12/31/2016: BUN 12; Creatinine, Ser 0.86; Potassium 3.9; Sodium 139 01/02/2017: Hemoglobin 15.9; Platelets 161   Lipid Panel    Component Value Date/Time   CHOL 146 01/01/2017 0408   TRIG 69 01/01/2017 0408   HDL 50 01/01/2017 0408   CHOLHDL 2.9 01/01/2017 0408   VLDL 14 01/01/2017 0408   LDLCALC 82 01/01/2017 0408    Additional studies/ records that were reviewed today include:  CT a/FFR 01/02/17 IMPRESSION: 1. Coronary calcium score of 618. This was 60 percentile for age and sex matched control.   2. Normal coronary origin with right dominance.   3. 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 We will obtain additional CT FFR analysis.   4. 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   Matthew Lozano   Electronically Signed: By: Matthew Lozano On: 01/01/2017 10:58   FINDINGS: FFRct analysis was performed on the original cardiac CT angiogram dataset. Diagrammatic representation of the FFRct analysis is provided in a separate PDF document in PACS. This dictation was created using the PDF document and an interactive 3D model of the results. 3D model is not available in the EMR/PACS. Normal FFR range is >0.80.   1. Left Main:  No significant stenosis.   2. LAD: No significant stenosis.  FFR 0.89. 3. LCX: No significant stenosis.  FFR 0.94. 4. RCA: No significant stenosis.  FFR 0.88.   IMPRESSION: 1.  CT FFR analysis didn't show any significant  stenosis.   IMPRESSION: 1. 2 mm pulmonary nodule in the periphery of the left upper lobe is highly nonspecific, but statistically likely benign. No follow-up needed if patient is low-risk. Non-contrast chest CT can be considered in 12 months if patient is high-risk. This recommendation follows the consensus statement: Guidelines for Management of Incidental Pulmonary Nodules Detected on CT Images: From the Fleischner Society 2017; Radiology 2017; 284:228-243.     Echocardiogram: 8.12.2018 Study Conclusions   - Left ventricle: The cavity size was normal. There was mild   concentric hypertrophy. Systolic function was normal. The   estimated ejection fraction was in the range of 55% to 60%. Wall   motion was normal; there were no regional wall motion  abnormalities. Doppler parameters are consistent with abnormal   left ventricular relaxation (grade 1 diastolic dysfunction). - Mitral valve: Mildly calcified annulus. Valve area by pressure   half-time: 2.08 cm^2. - Pericardium, extracardiac: A trivial pericardial effusion was   identified posterior to the heart.      ASSESSMENT:    1. Midsternal chest pain   2. CAD in native artery   3. MVP (mitral valve prolapse)   4. Type 2 diabetes mellitus without complication, without long-term current use of insulin (HCC)      PLAN:  In order of problems listed above:  Chest pain patient continues to have chest tightness and pressure. We'll schedule cardiac catheterization for definitive active diagnosis. Discussed with Dr.Ross who concurs.  I have reviewed the risks, indications, and alternatives to angioplasty and stenting with the patient. Risks include but are not limited to bleeding, infection, vascular injury, stroke, myocardial infection, arrhythmia, kidney injury, radiation-related injury in the case of prolonged fluoroscopy use, emergency cardiac surgery, and death. The patient understands the risks of serious complication is  low (<1%) and he agrees to proceed.     CAD 1 troponin 0.11 others -01/02/17 mild in the LAD and moderate diffuse in the mid RCA on CTA FFR 0.88 in the RCA no significant stenosis. Recommend he start Lipitor.  MVP  Diabetes mellitus2 recently started on Actos. Managed by primary care.    Medication Adjustments/Labs and Tests Ordered: Current medicines are reviewed at length with the patient today.  Concerns regarding medicines are outlined above.  Medication changes, Labs and Tests ordered today are listed in the Patient Instructions below. Patient Instructions  Medication Instructions:  Your physician recommends that you continue on your current medications as directed. Please refer to the Current Medication list given to you today.   Labwork: Your physician recommends that you return for lab work today for pre-procedure work CBC, BMET, PT/INR  Testing/Procedures:    [facility] CARDIOVASCULAR DIVISION [facility] ST OFFICE [address], [address] [place] [address] Dept: [phone] Loc: [phone]  Matthew Lozano  01/10/2017  You are scheduled for a left heart cath on 01/11/17, with Dr. Swaziland.  1. Please arrive at the [facility] (Main Entrance A) at [facility]: [address] [place], [place] [address] at 11:30 (two hours before your procedure to ensure your preparation). Free valet parking service is available.   Special note: Every effort is made to have your procedure done on time. Please understand that emergencies sometimes delay scheduled procedures.  2. Diet: nothing to eat or drink after midnight 01/10/17  3. Labs: CBC, BMET, PT/INR 01/10/17  4. Medication instructions in preparation for your procedure: please hold on taking your diabetes medication. You can take the rest with a sip of water including your aspirin.       5. Plan for one night stay--bring personal belongings. 6. Bring a current list of  your medications and current insurance cards. 7. You MUST have a responsible person to drive you home. 8. Someone MUST be with you the first 24 hours after you arrive home or your discharge will be delayed. 9. Please wear clothes that are easy to get on and off and wear slip-on shoes.  Thank you for allowing [place] to care for you!   -- [facility]   Any Other Special Instructions Will Be Listed Below (If Applicable).     If you need a refill on your cardiac  medications before your next appointment, please call your pharmacy.      Elson Clan, PA-C  01/10/2017 10:04 AM    [facility] [address] [place], [place], [place]  [address] Phone: [phone]; Fax: [phone]

## 2017-01-11 NOTE — Interval H&P Note (Signed)
History and Physical Interval Note:  01/11/2017 1:19 PM  Matthew Lozano  has presented today for surgery, with the diagnosis of cp  The various methods of treatment have been discussed with the patient and family. After consideration of risks, benefits and other options for treatment, the patient has consented to  Procedure(s): LEFT HEART CATH AND CORONARY ANGIOGRAPHY (N/A) as a surgical intervention .  The patient's history has been reviewed, patient examined, no change in status, stable for surgery.  I have reviewed the patient's chart and labs.  Questions were answered to the patient's satisfaction.   Cath Lab Visit (complete for each Cath Lab visit)  Clinical Evaluation Leading to the Procedure:   ACS: No.  Non-ACS:    Anginal Classification: CCS II  Anti-ischemic medical therapy: No Therapy  Non-Invasive Test Results: Low-risk stress test findings: cardiac mortality <1%/year  Prior CABG: No previous CABG        [name] 01/11/2017 1:19 PM

## 2017-01-11 NOTE — Discharge Instructions (Signed)

## 2017-01-12 ENCOUNTER — Encounter (HOSPITAL_COMMUNITY): Payer: Self-pay | Admitting: Cardiology

## 2017-01-12 NOTE — Progress Notes (Signed)
Thank you, Peter 

## 2017-01-21 ENCOUNTER — Encounter: Payer: Self-pay | Admitting: Cardiology

## 2017-01-21 ENCOUNTER — Ambulatory Visit (INDEPENDENT_AMBULATORY_CARE_PROVIDER_SITE_OTHER): Payer: Medicare Other | Admitting: Cardiology

## 2017-01-21 DIAGNOSIS — E119 Type 2 diabetes mellitus without complications: Secondary | ICD-10-CM

## 2017-01-21 DIAGNOSIS — I2510 Atherosclerotic heart disease of native coronary artery without angina pectoris: Secondary | ICD-10-CM

## 2017-01-21 DIAGNOSIS — E785 Hyperlipidemia, unspecified: Secondary | ICD-10-CM

## 2017-01-21 DIAGNOSIS — R079 Chest pain, unspecified: Secondary | ICD-10-CM

## 2017-01-21 DIAGNOSIS — I341 Nonrheumatic mitral (valve) prolapse: Secondary | ICD-10-CM | POA: Diagnosis not present

## 2017-01-21 NOTE — Assessment & Plan Note (Signed)
Type 2 NIDDM- Hgb A1c- 7.28 Dec 2016-no retinopathy, nephropathy, or neuropathy

## 2017-01-21 NOTE — Assessment & Plan Note (Signed)
LDL 82 Aug 2018-he was prescribed Lipitor 80 mg but is unable to tolerate this

## 2017-01-21 NOTE — Patient Instructions (Signed)
Medication Instructions: Please continue to stay off of the Coreg and Lipitor.  If you need a refill on your cardiac medications before your next appointment, please call your pharmacy.    Follow-Up: Your physician wants you to follow-up as needed.   Special Instructions:  Please consider taking Red Yeast Rice.   Thank you for choosing [facility]!!

## 2017-01-21 NOTE — Assessment & Plan Note (Signed)
Moderate RCA diseaseby CTA 01/01/17 but mild CAD by cath 01/11/17- 20% RCA, 30% RI

## 2017-01-21 NOTE — Progress Notes (Signed)
01/21/2017 Matthew Lozano   25-Jan-1951  [id number]  Primary Physician Catha Gosselin, MD Primary Cardiologist: Dr Royann Shivers  HPI:  66 y/o with a history of NIDDM x 10 yrs, admitted 12/31/16 with SSCP worrisome for [place]. His POC Troponin was 0.11 but subsequent Troponin were negative x 4. Her underwent coronary CTA which suggested moderate RCA disease and the plan was for medical Rx. He continued to complain of chest pain and was seen in f/u 01/10/17. He was set up for OP cath which was done 01/11/17. This revealed minor CAD-20% RCA, 20% RI. He is in the office today for follow up. He was prescribed high dose statin Rx and Coreg but he is not taking these. He says his cholesterol is "OK" and the Lipitor caused him to have palpitations. He also doesn't think he needs the Coreg.  He thinks the Nexium has helped, he has not had recurrenti chest pain.    Current Outpatient Prescriptions  Medication Sig Dispense Refill  . aspirin 81 MG EC tablet Take 1 tablet (81 mg total) by mouth daily.    . canagliflozin (INVOKANA) 300 MG TABS tablet Take 300 mg by mouth daily before breakfast.     No current facility-administered medications for this visit.     Allergies  Allergen Reactions  . Metformin And Related Diarrhea  . Androgel [Testosterone] Rash  . Codeine Other (See Comments)    Reaction:  GI upset   . Phenergan [Promethazine Hcl] Diarrhea    Past Medical History:  Diagnosis Date  . GERD (gastroesophageal reflux disease)   . Hemorrhoids   . MVP (mitral valve prolapse)   . Type II diabetes mellitus (HCC) dx'd 2008  . Viral meningitis 2001    Social History   Social History  . Marital status: Divorced    Spouse name: N/A  . Number of children: N/A  . Years of education: N/A   Occupational History  . Not on file.   Social History Main Topics  . Smoking status: Former Smoker    Packs/day: 0.50    Years: 10.00    Types: Cigarettes    Quit date: 37  . Smokeless tobacco: Never  Used  . Alcohol use Yes     Comment: 12/31/2016 "stopped 11/2016"  . Drug use: Yes    Types: Marijuana     Comment: 12/31/2016 "weekly"  . Sexual activity: Not Currently   Other Topics Concern  . Not on file   Social History Narrative  . No narrative on file     Family History  Problem Relation Age of Onset  . CVA Father   . Other Brother        AIDS     Review of Systems: General: negative for chills, fever, night sweats or weight changes.  Cardiovascular: negative for chest pain, dyspnea on exertion, edema, orthopnea, palpitations, paroxysmal nocturnal dyspnea or shortness of breath Dermatological: negative for rash Respiratory: negative for cough or wheezing Urologic: negative for hematuria Abdominal: negative for nausea, vomiting, diarrhea, bright red blood per rectum, melena, or hematemesis Neurologic: negative for visual changes, syncope, or dizziness All other systems reviewed and are otherwise negative except as noted above.    Blood pressure 122/68, pulse 60, height 5\' 11"  (1.803 m), weight 174 lb (78.9 kg).  General appearance: alert, cooperative and no distress Lungs: clear to auscultation bilaterally Heart: regular rate and rhythm Extremities: Rt radial site without hematoma Neurologic: Grossly normal    ASSESSMENT AND PLAN:  Chest pain with moderate risk of acute coronary syndrome Admitted 12/31/16 with chest pain, POC Troponin 0.11- subsequentTroponin negative x4,multiple RF  CAD in native artery Moderate RCA diseaseby CTA 01/01/17 but mild CAD by cath 01/11/17- 20% RCA, 30% RI  Dyslipidemia LDL 82 Aug 2018-he was prescribed Lipitor 80 mg but is unable to tolerate this  Non-insulin dependent type 2 diabetes mellitus (HCC) Type 2 NIDDM- Hgb A1c- 7.28 Dec 2016-no retinopathy, nephropathy, or neuropathy   PLAN  I explained the rationale for high dose statin Rx in pt's with DM and CAD (even mild CAD). I suggested he try Red yeast Rice - he may be more  willing to take that than a statin. He can visit this further with his PCP. His B/P is well controlled and his HR is 60- I don't think he needs the Coreg. We will be glad to see him again as needed.   Corine ShelterLuke Conrado Nance PA-C 01/21/2017 9:38 AM

## 2017-01-21 NOTE — Assessment & Plan Note (Signed)
Admitted 12/31/16 with chest pain, POC Troponin 0.11- subsequentTroponin negative x4,multiple RF

## 2017-05-18 DIAGNOSIS — R110 Nausea: Secondary | ICD-10-CM | POA: Diagnosis not present

## 2017-05-18 DIAGNOSIS — Z79899 Other long term (current) drug therapy: Secondary | ICD-10-CM | POA: Diagnosis not present

## 2017-05-18 DIAGNOSIS — R42 Dizziness and giddiness: Secondary | ICD-10-CM | POA: Diagnosis not present

## 2017-05-18 DIAGNOSIS — R05 Cough: Secondary | ICD-10-CM | POA: Diagnosis not present

## 2017-05-26 DIAGNOSIS — I2510 Atherosclerotic heart disease of native coronary artery without angina pectoris: Secondary | ICD-10-CM | POA: Diagnosis not present

## 2017-05-26 DIAGNOSIS — E7800 Pure hypercholesterolemia, unspecified: Secondary | ICD-10-CM | POA: Diagnosis not present

## 2017-05-26 DIAGNOSIS — H8110 Benign paroxysmal vertigo, unspecified ear: Secondary | ICD-10-CM | POA: Diagnosis not present

## 2017-05-26 DIAGNOSIS — R911 Solitary pulmonary nodule: Secondary | ICD-10-CM | POA: Diagnosis not present

## 2017-05-26 DIAGNOSIS — E118 Type 2 diabetes mellitus with unspecified complications: Secondary | ICD-10-CM | POA: Diagnosis not present

## 2017-05-26 DIAGNOSIS — E1165 Type 2 diabetes mellitus with hyperglycemia: Secondary | ICD-10-CM | POA: Diagnosis not present

## 2017-06-06 ENCOUNTER — Other Ambulatory Visit: Payer: Self-pay

## 2017-06-06 ENCOUNTER — Encounter: Payer: Self-pay | Admitting: Physical Therapy

## 2017-06-06 ENCOUNTER — Ambulatory Visit: Payer: Medicare Other | Attending: Family Medicine | Admitting: Physical Therapy

## 2017-06-06 DIAGNOSIS — R42 Dizziness and giddiness: Secondary | ICD-10-CM | POA: Diagnosis not present

## 2017-06-06 DIAGNOSIS — H8111 Benign paroxysmal vertigo, right ear: Secondary | ICD-10-CM | POA: Insufficient documentation

## 2017-06-06 DIAGNOSIS — R2681 Unsteadiness on feet: Secondary | ICD-10-CM | POA: Diagnosis not present

## 2017-06-06 NOTE — Therapy (Signed)
[facility] [address] [place], [place], [address] Phone: [phone]   Fax:  [phone]  Physical Therapy Evaluation  Patient Details  Name: Matthew Lozano MRN: [id number] Date of Birth: 11-05-1950 Referring Provider: Catha Gosselin, MD   Encounter Date: 06/06/2017  PT End of Session - 06/06/17 1136    Visit Number  1    Number of Visits  5    Date for PT Re-Evaluation  08/05/17 but anticipate quick D/C    Authorization Type  Medicare A/B    PT Start Time  0850    PT Stop Time  0931    PT Time Calculation (min)  41 min    Activity Tolerance  Patient tolerated treatment well    Behavior During Therapy  [facility] for tasks assessed/performed       Past Medical History:  Diagnosis Date  . GERD (gastroesophageal reflux disease)   . Hemorrhoids   . MVP (mitral valve prolapse)   . Type II diabetes mellitus (HCC) dx'd 2008  . Viral meningitis 2001    Past Surgical History:  Procedure Laterality Date  . LEFT HEART CATH AND CORONARY ANGIOGRAPHY N/A 01/11/2017   Procedure: LEFT HEART CATH AND CORONARY ANGIOGRAPHY;  Surgeon: Swaziland, Peter M, MD;  Location: [facility] INVASIVE CV LAB;  Service: Cardiovascular;  Laterality: N/A;  . LIPOMA EXCISION  2001   OFF BACK     There were no vitals filed for this visit.   Subjective Assessment - 06/06/17 0856    Subjective  Pt reports having new onset of disequilibrium with intermittent episodes of the room moving up/down.  Began 3 months ago - very mild when first sitting up from lying down.  Has to pause when first sitting up to let dizziness settle.  Denies nausea, vomiting, changes in hearing or vision.  Had an upper respiratory infection with headaches and strong antibiotics around Christmas.  Had vertigo 20 years ago when the room would spin horizontally - resolved spontaneously.  No falls.      Pertinent History  head trauma, vertigo, mitral valve prolapse, CAD, Type 2 DM, dyslipidemia     Patient Stated Goals  To make the dizziness go away    Currently in Pain?  No/denies         [facility] PT Assessment - 06/06/17 6962      Assessment   Medical Diagnosis  vertigo    Referring Provider  Catha Gosselin, MD    Onset Date/Surgical Date  03/06/17 about 3 months ago    Hand Dominance  Left    Prior Therapy  no      Precautions   Precautions  Other (comment)    Precaution Comments  head trauma, vertigo, mitral valve prolapse, CAD, Type 2 DM, and dyslipidemia.       Restrictions   Weight Bearing Restrictions  No      Balance Screen   Has the patient fallen in the past 6 months  No    Has the patient had a decrease in activity level because of a fear of falling?   No    Is the patient reluctant to leave their home because of a fear of falling?   No      Home Nurse, mental health  Private residence    Living Arrangements  Non-relatives/Friends roomate    Type of Home  House    Home Access  Stairs to enter    Entrance Stairs-Number of Steps  3    Entrance Stairs-Rails  None    Home Layout  One level      Prior Function   Level of Independence  Independent    Vocation  Full time employment    Vocation Requirements  Works in a kitchen at [employer]       Observation/Other Assessments   Focus on Therapeutic Outcomes (FOTO)   97 (3% limited)      Sensation   Light Touch  Impaired by gross assessment    Additional Comments  reports new numbness and weakness in LUE.  Denies clumsiness or dropping items from the LUE.  Denies issues with fine motor activities         Vestibular Assessment - 06/06/17 0906      Symptom Behavior   Type of Dizziness  Imbalance    Frequency of Dizziness  frequently each day    Duration of Dizziness  seconds    Aggravating Factors  Turning body quickly;Turning head quickly;Supine to sit;Sit to stand;Lying supine    Relieving Factors  Head stationary      Occulomotor Exam   Occulomotor Alignment  Normal    Spontaneous   Absent    Gaze-induced  Absent    Smooth Pursuits  Intact    Saccades  Poor trajectory with saccades from central to R side    Comment  convergence intact      Vestibulo-Occular Reflex   VOR to Slow Head Movement  Normal    VOR Cancellation  Corrective saccades mild    Comment  HIT: + to R, - to L      Visual Acuity   Static  8    Dynamic  4      Positional Testing   Dix-Hallpike  Dix-Hallpike Right;Dix-Hallpike Left      Dix-Hallpike Right   Dix-Hallpike Right Duration  12 seconds    Dix-Hallpike Right Symptoms  Upbeat, right rotatory nystagmus      Dix-Hallpike Left   Dix-Hallpike Left Duration  >1 minute     Dix-Hallpike Left Symptoms  Upbeat, left rotatory nystagmus         Objective measurements completed on examination: See above findings.              PT Education - 06/06/17 1136    Education provided  Yes    Education Details  Clinical findings, PT POC and goals    Person(s) Educated  Patient    Methods  Explanation    Comprehension  Verbalized understanding       PT Short Term Goals - 06/06/17 1137      PT SHORT TERM GOAL #1   Title  = LTG        PT Long Term Goals - 06/06/17 1137      PT LONG TERM GOAL #1   Title  (8 week certification but anticipate pt will D/C by 4 weeks)  Pt will demonstrate independence with vestibular HEP    Time  4    Period  Weeks    Status  New    Target Date  07/06/17      PT LONG TERM GOAL #2   Title  Pt will be negative for nystagmus and positional vertigo on L and R Hallpike-Dix    Baseline  + for R posterior canal canalithiasis    Time  4    Period  Weeks    Status  New    Target Date  07/06/17  PT LONG TERM GOAL #3   Title  Pt will demonstrate improved use of VOR with 2-3 line difference on DVA    Baseline  4 line difference    Time  4    Period  Weeks    Status  New    Target Date  07/06/17      PT LONG TERM GOAL #4   Title  Overall function will maintain >/=97% as indicated on FOTO      Baseline  97, 3% limited on eval    Time  4    Period  Weeks    Status  New    Target Date  07/06/17             Plan - 06/06/17 1141    Clinical Impression Statement  Pt is a 67 year old male referred to Neuro OPPT for evaluation of vertigo.  Pt's PMH is significant for the following: h/o head trauma, vertigo, mitral valve prolapse, CAD, Type 2 DM, and dyslipidemia. The following deficits were noted during pt's exam: R posterior canal canalithiasis, disequilibrium and R vestibular hypofunction.  Pt also reported new numbness in L fingers and weakness in LUE > RUE; will monitor but will not be the focus of therapy treatment interventions during this episode of care.  Pt would benefit from skilled PT to address these impairments and functional limitations to maximize functional mobility independence and reduce falls risk    History and Personal Factors relevant to plan of care:  head trauma, vertigo, mitral valve prolapse, CAD, Type 2 DM, and dyslipidemia.     Clinical Presentation  Stable    Clinical Presentation due to:  head trauma, vertigo, mitral valve prolapse, CAD, Type 2 DM, and dyslipidemia.     Clinical Decision Making  Low    Rehab Potential  Good    PT Frequency  1x / week    PT Duration  8 weeks    PT Treatment/Interventions  ADLs/Self Care Home Management;Canalith Repostioning;Functional mobility training;Therapeutic activities;Therapeutic exercise;Balance training;Neuromuscular re-education;Patient/family education;Vestibular    PT Next Visit Plan  reassess R posterior canal; treat if indicated.  Teach home self-epley maneuver with pillow.  teach x 1 viewing    PT Home Exercise Plan  x 1 viewing, home self epley maneuver    Consulted and Agree with Plan of Care  Patient       Patient will benefit from skilled therapeutic intervention in order to improve the following deficits and impairments:  Dizziness, Decreased balance  Visit Diagnosis: BPPV (benign paroxysmal  positional vertigo), right  Dizziness and giddiness  Unsteadiness on feet     Problem List Patient Active Problem List   Diagnosis Date Noted  . Dyslipidemia 01/21/2017  . 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 Male erectile dysfunction 01/10/2017  . 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  . Chest pain with moderate risk of acute coronary syndrome 01/02/2017  . Hemorrhage of anus and rectum 08/29/2014  . 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 Non-insulin dependent type 2 diabetes mellitus (HCC) 10/26/2013  . MVP (mitral valve prolapse)     Dierdre HighmanAudra F Kevan Prouty, PT, DPT 06/06/17    11:48 AM    [facility] [address] [place], [place], [address] Phone: [phone]   Fax:  [phone]  Name: Matthew Lozano MRN: [id number] Date of Birth: 02/03/1951

## 2017-06-13 ENCOUNTER — Ambulatory Visit: Payer: Medicare Other | Admitting: Physical Therapy

## 2017-06-13 ENCOUNTER — Encounter: Payer: Self-pay | Admitting: Physical Therapy

## 2017-06-13 DIAGNOSIS — R2681 Unsteadiness on feet: Secondary | ICD-10-CM

## 2017-06-13 DIAGNOSIS — R42 Dizziness and giddiness: Secondary | ICD-10-CM | POA: Diagnosis not present

## 2017-06-13 DIAGNOSIS — H8111 Benign paroxysmal vertigo, right ear: Secondary | ICD-10-CM | POA: Diagnosis not present

## 2017-06-13 NOTE — Therapy (Signed)
[facility] [address] [place], [place], [address] Phone: [phone]   Fax:  [phone]  Physical Therapy Treatment  Patient Details  Name: Matthew Lozano MRN: [id number] Date of Birth: Dec 25, 1950 Referring Provider: Catha Gosselin, MD   Encounter Date: 06/13/2017  PT End of Session - 06/13/17 0846    Visit Number  2    Number of Visits  5    Date for PT Re-Evaluation  08/05/17 but anticipate quick D/C    Authorization Type  Medicare A/B    PT Start Time  0803    PT Stop Time  0843    PT Time Calculation (min)  40 min    Activity Tolerance  Patient tolerated treatment well    Behavior During Therapy  [facility] for tasks assessed/performed       Past Medical History:  Diagnosis Date  . GERD (gastroesophageal reflux disease)   . Hemorrhoids   . MVP (mitral valve prolapse)   . Type II diabetes mellitus (HCC) dx'd 2008  . Viral meningitis 2001    Past Surgical History:  Procedure Laterality Date  . LEFT HEART CATH AND CORONARY ANGIOGRAPHY N/A 01/11/2017   Procedure: LEFT HEART CATH AND CORONARY ANGIOGRAPHY;  Surgeon: Swazil[place], Peter M, MD;  [facility] INVASIVE CV LAB;  Service: Cardiovascular;  Laterality: N/A;  . LIPOMA EXCISION  2001   OFF BACK     There were no vitals filed for this visit.  Subjective Assessment - 06/13/17 0806    Subjective  Pt reports feeling "90%" better, has stopped taking Meclizine and doesn't have to sit at the edge of the bed x 10 seconds before getting up.      Pertinent History  head trauma, vertigo, mitral valve prolapse, CAD, Type 2 DM, dyslipidemia    Patient Stated Goals  To make the dizziness go away    Currently in Pain?  No/denies             Vestibular Assessment - 06/13/17 0810      Dix-Hallpike Right   Dix-Hallpike Right Duration  0    Dix-Hallpike Right Symptoms  No nystagmus      Dix-Hallpike Left   Dix-Hallpike Left Duration  0    Dix-Hallpike Left Symptoms   No nystagmus               Vestibular Treatment/Exercise - 06/13/17 0813      Vestibular Treatment/Exercise   Vestibular Treatment Provided  Canalith Repositioning;Gaze    Canalith Repositioning  Epley Manuever Right home self-treatment with instructions    Gaze Exercises  X1 Viewing Horizontal;X1 Viewing Vertical       EPLEY MANUEVER RIGHT   Number of Reps   1    Response Details   education on how to perform self-treatment at home      X1 Viewing Horizontal   Foot Position  feet apart, feet together    Reps  2    Comments  30-45 seconds with cues to maintain shoulder position      X1 Viewing Vertical   Foot Position  feet apart, feet together    Reps  2    Comments  30-45 seconds            PT Education - 06/13/17 0845    Education provided  Yes    Education Details  Epley self-treatment, x 1 viewing    Person(s) Educated  Patient    Methods  Explanation;Demonstration;Handout  Comprehension  Verbalized understanding;Returned demonstration       PT Short Term Goals - 06/06/17 1137      PT SHORT TERM GOAL #1   Title  = LTG        PT Long Term Goals - 06/06/17 1137      PT LONG TERM GOAL #1   Title  (8 week certification but anticipate pt will D/C by 4 weeks)  Pt will demonstrate independence with vestibular HEP    Time  4    Period  Weeks    Status  New    Target Date  07/06/17      PT LONG TERM GOAL #2   Title  Pt will be negative for nystagmus and positional vertigo on L and R Hallpike-Dix    Baseline  + for R posterior canal canalithiasis    Time  4    Period  Weeks    Status  New    Target Date  07/06/17      PT LONG TERM GOAL #3   Title  Pt will demonstrate improved use of VOR with 2-3 line difference on DVA    Baseline  4 line difference    Time  4    Period  Weeks    Status  New    Target Date  07/06/17      PT LONG TERM GOAL #4   Title  Overall function will maintain >/=97% as indicated on FOTO     Baseline  97, 3% limited  on eval    Time  4    Period  Weeks    Status  New    Target Date  07/06/17            Plan - 06/13/17 1131    Clinical Impression Statement  Performed re-assessment of peripheral canals with no indication of persistent BPPV.  Focused session on teaching patient how to perform home Epley maneuver with pillow in case of return of BPPV and x1 viewing in standing for gaze adaptation.  Pt tolerated well; if pt continues to present with negative symptoms and proficiency with x 1 viewing will likely D/C at next visit.    Rehab Potential  Good    PT Frequency  1x / week    PT Duration  8 weeks    PT Treatment/Interventions  ADLs/Self Care Home Management;Canalith Repostioning;Functional mobility training;Therapeutic activities;Therapeutic exercise;Balance training;Neuromuscular re-education;Patient/family education;Vestibular    PT Next Visit Plan  if pt is negative for BPPV, teach how to progress x 1 viewing, FOTO, DVA, D/C    PT Home Exercise Plan  x 1 viewing, home self epley maneuver    Consulted and Agree with Plan of Care  Patient       Patient will benefit from skilled therapeutic intervention in order to improve the following deficits and impairments:  Dizziness, Decreased balance  Visit Diagnosis: Dizziness and giddiness  Unsteadiness on feet     Problem List Patient Active Problem List   Diagnosis Date Noted  . Dyslipidemia 01/21/2017  . 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 Male erectile dysfunction 01/10/2017  . 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  . Chest pain with moderate risk of acute coronary syndrome 01/02/2017  . Hemorrhage of anus and rectum 08/29/2014  . 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 Non-insulin dependent type 2 diabetes mellitus (HCC) 10/26/2013  . MVP (mitral valve prolapse)     Dierdre HighmanAudra F Tinzlee Craker, PT, DPT 06/13/17    11:34 AM  [facility] [address] [place], [place], [address] Phone:  [phone]   Fax:  [phone]  Name: Matthew Lozano MRN: [id number] Date of Birth: 1950-10-05

## 2017-06-13 NOTE — Patient Instructions (Signed)
How to Perform the Epley Maneuver The Epley maneuver is an exercise that relieves symptoms of vertigo. Vertigo is the feeling that you or your surroundings are moving when they are not. When you feel vertigo, you may feel like the room is spinning and have trouble walking. Dizziness is a little different than vertigo. When you are dizzy, you may feel unsteady or light-headed. You can do this maneuver at home whenever you have symptoms of vertigo. You can do it up to 3 times a day until your symptoms go away. Even though the Epley maneuver may relieve your vertigo for a few weeks, it is possible that your symptoms will return. This maneuver relieves vertigo, but it does not relieve dizziness. What are the risks? If it is done correctly, the Epley maneuver is considered safe. Sometimes it can lead to dizziness or nausea that goes away after a short time. If you develop other symptoms, such as changes in vision, weakness, or numbness, stop doing the maneuver and call your health care provider. How to perform the Epley maneuver 1. Sit on the edge of a bed or table with your back straight and your legs extended or hanging over the edge of the bed or table. 2. Turn your head halfway toward the affected ear or side. 3. Lie backward quickly with your head turned until you are lying flat on your back. You may want to position a pillow under your shoulders. 4. Hold this position for 30 seconds. You may experience an attack of vertigo. This is normal. 5. Turn your head to the opposite direction until your unaffected ear is facing the floor. 6. Hold this position for 30 seconds. You may experience an attack of vertigo. This is normal. Hold this position until the vertigo stops. 7. Turn your whole body to the same side as your head. Hold for another 30 seconds. 8. Sit back up. You can repeat this exercise up to 3 times a day. Follow these instructions at home:  After doing the Epley maneuver, you can return to  your normal activities.  Ask your health care provider if there is anything you should do at home to prevent vertigo. He or she may recommend that you: ? Keep your head raised (elevated) with two or more pillows while you sleep. ? Do not sleep on the side of your affected ear. ? Get up slowly from bed. ? Avoid sudden movements during the day. ? Avoid extreme head movement, like looking up or bending over. Contact a health care provider if:  Your vertigo gets worse.  You have other symptoms, including: ? Nausea. ? Vomiting. ? Headache. Get help right away if:  You have vision changes.  You have a severe or worsening headache or neck pain.  You cannot stop vomiting.  You have new numbness or weakness in any part of your body. Summary  Vertigo is the feeling that you or your surroundings are moving when they are not.  The Epley maneuver is an exercise that relieves symptoms of vertigo.  If the Epley maneuver is done correctly, it is considered safe. You can do it up to 3 times a day. This information is not intended to replace advice given to you by your health care provider. Make sure you discuss any questions you have with your health care provider. Document Released: 05/15/2013 Document Revised: 03/30/2016 Document Reviewed: 03/30/2016 Elsevier Interactive Patient Education  2017 Elsevier Inc.   Gaze Stabilization - Tip Card  1.Target must remain   Card  1.Target must remain in focus, not blurry, and appear stationary while head is in motion. 2.Perform exercises with small head movements (45 to either side of midline). 3.Increase speed of head motion so long as target is in focus. 4.If you wear eyeglasses, be sure you can see target through lens (therapist will give specific instructions for bifocal / progressive lenses). 5.These exercises may provoke dizziness or nausea. Work through these symptoms. If too dizzy, slow head movement slightly. Rest between each exercise. 6.Exercises demand  concentration; avoid distractions. 7.For safety, perform standing exercises close to a counter, wall, corner, or next to someone.  Copyright  VHI. All rights reserved.   Gaze Stabilization - Standing Feet Apart   Feet shoulder width apart, keeping eyes on target on wall 3 feet away, tilt head down slightly and move head side to side for 30 seconds. Repeat while moving head up and down for 30 seconds. *Work up to tolerating 60 seconds, as able. Do 2-3 sessions per day.   Copyright  VHI. All rights reserved.

## 2017-06-21 ENCOUNTER — Ambulatory Visit: Payer: Medicare Other | Admitting: Physical Therapy

## 2017-06-22 ENCOUNTER — Encounter: Payer: Self-pay | Admitting: Physical Therapy

## 2017-06-22 NOTE — Therapy (Signed)
[facility] [address] [place], [place], [address] Phone: [phone]   Fax:  [phone]  Patient Details  Name: Matthew Lozano MRN: [id number] Date of Birth: 1951-01-18 Referring Provider:  No ref. provider found  Encounter Date: 06/22/2017  PHYSICAL THERAPY DISCHARGE SUMMARY  Visits from Start of Care: 2  Current functional level related to goals / functional outcomes:  Unable to formally assess rest of LTG due to pt not returning for final visits.  Pt stating dizziness has completely resolved and did not need to return to therapy.  PT Long Term Goals - 06/22/17 2030      PT LONG TERM GOAL #1   Title  (8 week certification but anticipate pt will D/C by 4 weeks)  Pt will demonstrate independence with vestibular HEP    Time  4    Period  Weeks    Status  Achieved      PT LONG TERM GOAL #2   Title  Pt will be negative for nystagmus and positional vertigo on L and R Hallpike-Dix    Baseline  + for R posterior canal canalithiasis    Time  4    Period  Weeks    Status  Achieved      PT LONG TERM GOAL #3   Title  Pt will demonstrate improved use of VOR with 2-3 line difference on DVA    Baseline  4 line difference    Time  4    Period  Weeks    Status  Unable to assess      PT LONG TERM GOAL #4   Title  Overall function will maintain >/=97% as indicated on FOTO     Baseline  97, 3% limited on eval    Time  4    Period  Weeks    Status  Unable to assess         Remaining deficits: None   Education / Equipment: HEP  Plan: Patient agrees to discharge.  Patient goals were partially met. Patient is being discharged due to the patient's request.  ?????     Rico Junker, PT, DPT 06/22/17    8:36 PM    [facility] [address] [place], [place], [address] Phone: [phone]   Fax:  [phone]

## 2017-06-27 ENCOUNTER — Encounter: Payer: PRIVATE HEALTH INSURANCE | Admitting: Physical Therapy

## 2018-01-18 DIAGNOSIS — E118 Type 2 diabetes mellitus with unspecified complications: Secondary | ICD-10-CM | POA: Diagnosis not present

## 2018-01-18 DIAGNOSIS — Z79899 Other long term (current) drug therapy: Secondary | ICD-10-CM | POA: Diagnosis not present

## 2018-01-18 DIAGNOSIS — Z125 Encounter for screening for malignant neoplasm of prostate: Secondary | ICD-10-CM | POA: Diagnosis not present

## 2018-01-18 DIAGNOSIS — R911 Solitary pulmonary nodule: Secondary | ICD-10-CM | POA: Diagnosis not present

## 2018-01-18 DIAGNOSIS — E7800 Pure hypercholesterolemia, unspecified: Secondary | ICD-10-CM | POA: Diagnosis not present

## 2018-01-18 DIAGNOSIS — I2510 Atherosclerotic heart disease of native coronary artery without angina pectoris: Secondary | ICD-10-CM | POA: Diagnosis not present

## 2018-01-20 ENCOUNTER — Other Ambulatory Visit: Payer: Self-pay | Admitting: Family Medicine

## 2018-01-20 DIAGNOSIS — R911 Solitary pulmonary nodule: Secondary | ICD-10-CM

## 2018-01-30 ENCOUNTER — Ambulatory Visit
Admission: RE | Admit: 2018-01-30 | Discharge: 2018-01-30 | Disposition: A | Discharge status: Home / Self Care | Payer: Medicare Other | Source: Ambulatory Visit | Attending: Family Medicine | Admitting: Family Medicine

## 2018-01-30 DIAGNOSIS — Z79899 Other long term (current) drug therapy: Secondary | ICD-10-CM | POA: Diagnosis not present

## 2018-01-30 DIAGNOSIS — I2510 Atherosclerotic heart disease of native coronary artery without angina pectoris: Secondary | ICD-10-CM | POA: Diagnosis not present

## 2018-01-30 DIAGNOSIS — R911 Solitary pulmonary nodule: Secondary | ICD-10-CM | POA: Diagnosis not present

## 2018-01-30 DIAGNOSIS — Z125 Encounter for screening for malignant neoplasm of prostate: Secondary | ICD-10-CM | POA: Diagnosis not present

## 2018-01-30 DIAGNOSIS — E118 Type 2 diabetes mellitus with unspecified complications: Secondary | ICD-10-CM | POA: Diagnosis not present

## 2018-01-30 DIAGNOSIS — E7800 Pure hypercholesterolemia, unspecified: Secondary | ICD-10-CM | POA: Diagnosis not present

## 2018-08-01 ENCOUNTER — Inpatient Hospital Stay (HOSPITAL_COMMUNITY)
Admission: EM | Admit: 2018-08-01 | Discharge: 2018-08-05 | DRG: 641 | Disposition: A | Discharge status: Home / Self Care | Payer: Medicare Other | Attending: Family Medicine | Admitting: Family Medicine

## 2018-08-01 ENCOUNTER — Other Ambulatory Visit: Payer: Self-pay

## 2018-08-01 ENCOUNTER — Encounter (HOSPITAL_COMMUNITY): Payer: Self-pay

## 2018-08-01 DIAGNOSIS — Z79899 Other long term (current) drug therapy: Secondary | ICD-10-CM

## 2018-08-01 DIAGNOSIS — A084 Viral intestinal infection, unspecified: Secondary | ICD-10-CM | POA: Diagnosis present

## 2018-08-01 DIAGNOSIS — D72829 Elevated white blood cell count, unspecified: Secondary | ICD-10-CM | POA: Diagnosis present

## 2018-08-01 DIAGNOSIS — N179 Acute kidney failure, unspecified: Secondary | ICD-10-CM | POA: Diagnosis not present

## 2018-08-01 DIAGNOSIS — Z888 Allergy status to other drugs, medicaments and biological substances status: Secondary | ICD-10-CM

## 2018-08-01 DIAGNOSIS — R17 Unspecified jaundice: Secondary | ICD-10-CM | POA: Diagnosis not present

## 2018-08-01 DIAGNOSIS — Z87891 Personal history of nicotine dependence: Secondary | ICD-10-CM

## 2018-08-01 DIAGNOSIS — Z79890 Hormone replacement therapy (postmenopausal): Secondary | ICD-10-CM

## 2018-08-01 DIAGNOSIS — K219 Gastro-esophageal reflux disease without esophagitis: Secondary | ICD-10-CM | POA: Diagnosis present

## 2018-08-01 DIAGNOSIS — E860 Dehydration: Secondary | ICD-10-CM

## 2018-08-01 DIAGNOSIS — E1165 Type 2 diabetes mellitus with hyperglycemia: Secondary | ICD-10-CM | POA: Diagnosis not present

## 2018-08-01 DIAGNOSIS — Z823 Family history of stroke: Secondary | ICD-10-CM

## 2018-08-01 DIAGNOSIS — Z7982 Long term (current) use of aspirin: Secondary | ICD-10-CM

## 2018-08-01 DIAGNOSIS — R112 Nausea with vomiting, unspecified: Secondary | ICD-10-CM | POA: Diagnosis present

## 2018-08-01 DIAGNOSIS — K529 Noninfective gastroenteritis and colitis, unspecified: Secondary | ICD-10-CM | POA: Diagnosis present

## 2018-08-01 DIAGNOSIS — E871 Hypo-osmolality and hyponatremia: Secondary | ICD-10-CM | POA: Diagnosis not present

## 2018-08-01 LAB — COMPREHENSIVE METABOLIC PANEL
ALT: 19 U/L (ref 0–44)
AST: 21 U/L (ref 15–41)
Albumin: 4.9 g/dL (ref 3.5–5.0)
Alkaline Phosphatase: 90 U/L (ref 38–126)
Anion gap: 25 — ABNORMAL HIGH (ref 5–15)
BUN: 29 mg/dL — ABNORMAL HIGH (ref 8–23)
CO2: 11 mmol/L — ABNORMAL LOW (ref 22–32)
Calcium: 10.6 mg/dL — ABNORMAL HIGH (ref 8.9–10.3)
Chloride: 96 mmol/L — ABNORMAL LOW (ref 98–111)
Creatinine, Ser: 1.95 mg/dL — ABNORMAL HIGH (ref 0.61–1.24)
GFR calc Af Amer: 40 mL/min — ABNORMAL LOW (ref >60)
GFR calc non Af Amer: 35 mL/min — ABNORMAL LOW (ref >60)
Glucose, Bld: 356 mg/dL — ABNORMAL HIGH (ref 70–99)
Potassium: 4.4 mmol/L (ref 3.5–5.1)
Sodium: 132 mmol/L — ABNORMAL LOW (ref 135–145)
Total Bilirubin: 2.2 mg/dL — ABNORMAL HIGH (ref 0.3–1.2)
Total Protein: 8.4 g/dL — ABNORMAL HIGH (ref 6.5–8.1)

## 2018-08-01 LAB — CBC
HCT: 54.2 % — ABNORMAL HIGH (ref 39.0–52.0)
Hemoglobin: 19.1 g/dL — ABNORMAL HIGH (ref 13.0–17.0)
MCH: 31.8 pg (ref 26.0–34.0)
MCHC: 35.2 g/dL (ref 30.0–36.0)
MCV: 90.3 fL (ref 80.0–100.0)
Platelets: 255 10*3/uL (ref 150–400)
RBC: 6 MIL/uL — ABNORMAL HIGH (ref 4.22–5.81)
RDW: 12.1 % (ref 11.5–15.5)
WBC: 18.2 10*3/uL — ABNORMAL HIGH (ref 4.0–10.5)
nRBC: 0 % (ref 0.0–0.2)

## 2018-08-01 LAB — LIPASE, BLOOD: Lipase: 43 U/L (ref 11–51)

## 2018-08-01 MED ORDER — SODIUM CHLORIDE 0.9% FLUSH
3.0000 mL | Freq: Once | INTRAVENOUS | Status: AC
  Administered 2018-08-02: 3 mL via INTRAVENOUS

## 2018-08-01 MED ORDER — ONDANSETRON 4 MG PO TBDP
4.0000 mg | ORAL_TABLET | Freq: Once | ORAL | Status: AC | PRN
Start: 2018-08-01 — End: 2018-08-01
  Administered 2018-08-01: 4 mg via ORAL
  Filled 2018-08-01: qty 1

## 2018-08-01 NOTE — ED Triage Notes (Signed)
Pt reports generalized abd pain and vomiting since Friday. Pt reports multiple vomiting episodes today. Pt denies any diarrhea.

## 2018-08-02 ENCOUNTER — Observation Stay (HOSPITAL_COMMUNITY): Payer: Medicare Other

## 2018-08-02 ENCOUNTER — Encounter (HOSPITAL_COMMUNITY): Payer: Self-pay

## 2018-08-02 DIAGNOSIS — E1165 Type 2 diabetes mellitus with hyperglycemia: Secondary | ICD-10-CM

## 2018-08-02 DIAGNOSIS — K529 Noninfective gastroenteritis and colitis, unspecified: Secondary | ICD-10-CM

## 2018-08-02 DIAGNOSIS — D72829 Elevated white blood cell count, unspecified: Secondary | ICD-10-CM | POA: Diagnosis present

## 2018-08-02 DIAGNOSIS — N179 Acute kidney failure, unspecified: Secondary | ICD-10-CM

## 2018-08-02 DIAGNOSIS — E871 Hypo-osmolality and hyponatremia: Secondary | ICD-10-CM

## 2018-08-02 LAB — CBC WITH DIFFERENTIAL/PLATELET
Abs Immature Granulocytes: 0.14 10*3/uL — ABNORMAL HIGH (ref 0.00–0.07)
Basophils Absolute: 0 10*3/uL (ref 0.0–0.1)
Basophils Relative: 0 %
Eosinophils Absolute: 0 10*3/uL (ref 0.0–0.5)
Eosinophils Relative: 0 %
HCT: 50.8 % (ref 39.0–52.0)
Hemoglobin: 18.1 g/dL — ABNORMAL HIGH (ref 13.0–17.0)
Immature Granulocytes: 1 %
Lymphocytes Relative: 5 %
Lymphs Abs: 0.9 10*3/uL (ref 0.7–4.0)
MCH: 32.3 pg (ref 26.0–34.0)
MCHC: 35.6 g/dL (ref 30.0–36.0)
MCV: 90.7 fL (ref 80.0–100.0)
Monocytes Absolute: 1.2 10*3/uL — ABNORMAL HIGH (ref 0.1–1.0)
Monocytes Relative: 7 %
Neutro Abs: 14.9 10*3/uL — ABNORMAL HIGH (ref 1.7–7.7)
Neutrophils Relative %: 87 %
Platelets: 223 10*3/uL (ref 150–400)
RBC: 5.6 MIL/uL (ref 4.22–5.81)
RDW: 12.4 % (ref 11.5–15.5)
WBC: 17.2 10*3/uL — ABNORMAL HIGH (ref 4.0–10.5)
nRBC: 0 % (ref 0.0–0.2)

## 2018-08-02 LAB — HIV ANTIBODY (ROUTINE TESTING W REFLEX): HIV Screen 4th Generation wRfx: NONREACTIVE

## 2018-08-02 LAB — POCT I-STAT EG7
Acid-base deficit: 13 mmol/L — ABNORMAL HIGH (ref 0.0–2.0)
Bicarbonate: 11.6 mmol/L — ABNORMAL LOW (ref 20.0–28.0)
Calcium, Ion: 1.35 mmol/L (ref 1.15–1.40)
HCT: 54 % — ABNORMAL HIGH (ref 39.0–52.0)
Hemoglobin: 18.4 g/dL — ABNORMAL HIGH (ref 13.0–17.0)
O2 Saturation: 47 %
Potassium: 4.5 mmol/L (ref 3.5–5.1)
Sodium: 129 mmol/L — ABNORMAL LOW (ref 135–145)
TCO2: 12 mmol/L — ABNORMAL LOW (ref 22–32)
pCO2, Ven: 25.4 mmHg — ABNORMAL LOW (ref 44.0–60.0)
pH, Ven: 7.267 (ref 7.250–7.430)
pO2, Ven: 28 mmHg — CL (ref 32.0–45.0)

## 2018-08-02 LAB — URINALYSIS, ROUTINE W REFLEX MICROSCOPIC
Bacteria, UA: NONE SEEN
Bilirubin Urine: NEGATIVE
Glucose, UA: >=500 mg/dL — AB
Ketones, ur: 80 mg/dL — AB
Leukocytes,Ua: NEGATIVE
Nitrite: NEGATIVE
Protein, ur: 30 mg/dL — AB
Specific Gravity, Urine: 1.022 (ref 1.005–1.030)
pH: 5 (ref 5.0–8.0)

## 2018-08-02 LAB — COMPREHENSIVE METABOLIC PANEL
ALT: 18 U/L (ref 0–44)
AST: 17 U/L (ref 15–41)
Albumin: 4.4 g/dL (ref 3.5–5.0)
Alkaline Phosphatase: 78 U/L (ref 38–126)
Anion gap: 19 — ABNORMAL HIGH (ref 5–15)
BUN: 28 mg/dL — ABNORMAL HIGH (ref 8–23)
CO2: 14 mmol/L — ABNORMAL LOW (ref 22–32)
Calcium: 10 mg/dL (ref 8.9–10.3)
Chloride: 100 mmol/L (ref 98–111)
Creatinine, Ser: 1.82 mg/dL — ABNORMAL HIGH (ref 0.61–1.24)
GFR calc Af Amer: 44 mL/min — ABNORMAL LOW (ref >60)
GFR calc non Af Amer: 38 mL/min — ABNORMAL LOW (ref >60)
Glucose, Bld: 318 mg/dL — ABNORMAL HIGH (ref 70–99)
Potassium: 4.5 mmol/L (ref 3.5–5.1)
Sodium: 133 mmol/L — ABNORMAL LOW (ref 135–145)
Total Bilirubin: 2 mg/dL — ABNORMAL HIGH (ref 0.3–1.2)
Total Protein: 7.2 g/dL (ref 6.5–8.1)

## 2018-08-02 LAB — GLUCOSE, CAPILLARY
Glucose-Capillary: 275 mg/dL — ABNORMAL HIGH (ref 70–99)
Glucose-Capillary: 188 mg/dL — ABNORMAL HIGH (ref 70–99)
Glucose-Capillary: 174 mg/dL — ABNORMAL HIGH (ref 70–99)
Glucose-Capillary: 178 mg/dL — ABNORMAL HIGH (ref 70–99)
Glucose-Capillary: 164 mg/dL — ABNORMAL HIGH (ref 70–99)

## 2018-08-02 LAB — SODIUM, URINE, RANDOM: Sodium, Ur: 18 mmol/L

## 2018-08-02 LAB — CREATININE, URINE, RANDOM: Creatinine, Urine: 48.98 mg/dL

## 2018-08-02 MED ORDER — SODIUM CHLORIDE 0.9 % IV BOLUS: 1000.0000 mL | Freq: Once | INTRAVENOUS | Status: DC

## 2018-08-02 MED ORDER — SODIUM CHLORIDE 0.9 % IV SOLN
INTRAVENOUS | Status: DC
  Administered 2018-08-02 – 2018-08-03 (×2): via INTRAVENOUS

## 2018-08-02 MED ORDER — SODIUM CHLORIDE 0.9 % IV SOLN
INTRAVENOUS | Status: DC
  Administered 2018-08-02 (×2): via INTRAVENOUS

## 2018-08-02 MED ORDER — ONDANSETRON HCL 4 MG PO TABS: 4.0000 mg | ORAL_TABLET | Freq: Four times a day (QID) | ORAL | Status: DC | PRN

## 2018-08-02 MED ORDER — ONDANSETRON HCL 4 MG/2ML IJ SOLN
4.0000 mg | Freq: Four times a day (QID) | INTRAMUSCULAR | Status: DC | PRN
  Administered 2018-08-02 – 2018-08-03 (×4): 4 mg via INTRAVENOUS
  Filled 2018-08-02 (×4): qty 2

## 2018-08-02 MED ORDER — INSULIN ASPART 100 UNIT/ML ~~LOC~~ SOLN
0.0000 [IU] | SUBCUTANEOUS | Status: DC
  Administered 2018-08-02 (×2): 2 [IU] via SUBCUTANEOUS
  Administered 2018-08-02: 5 [IU] via SUBCUTANEOUS
  Administered 2018-08-02 – 2018-08-03 (×4): 2 [IU] via SUBCUTANEOUS

## 2018-08-02 MED ORDER — ACETAMINOPHEN 325 MG PO TABS: 650.0000 mg | ORAL_TABLET | Freq: Four times a day (QID) | ORAL | Status: DC | PRN

## 2018-08-02 MED ORDER — FENTANYL CITRATE (PF) 100 MCG/2ML IJ SOLN: 25.0000 ug | INTRAMUSCULAR | Status: DC | PRN

## 2018-08-02 MED ORDER — ACETAMINOPHEN 650 MG RE SUPP: 650.0000 mg | Freq: Four times a day (QID) | RECTAL | Status: DC | PRN

## 2018-08-02 MED ORDER — ONDANSETRON HCL 4 MG/2ML IJ SOLN
4.0000 mg | Freq: Once | INTRAMUSCULAR | Status: AC
  Administered 2018-08-02: 4 mg via INTRAVENOUS
  Filled 2018-08-02: qty 2

## 2018-08-02 MED ORDER — TRAMADOL HCL 50 MG PO TABS
50.0000 mg | ORAL_TABLET | Freq: Four times a day (QID) | ORAL | Status: DC | PRN
  Administered 2018-08-02 – 2018-08-03 (×2): 50 mg via ORAL
  Filled 2018-08-02 (×2): qty 1

## 2018-08-02 MED ORDER — SODIUM CHLORIDE 0.9 % IV BOLUS
1000.0000 mL | Freq: Once | INTRAVENOUS | Status: AC
  Administered 2018-08-02: 1000 mL via INTRAVENOUS

## 2018-08-02 MED ORDER — HEPARIN SODIUM (PORCINE) 5000 UNIT/ML IJ SOLN
5000.0000 [IU] | Freq: Three times a day (TID) | INTRAMUSCULAR | Status: DC
  Administered 2018-08-02 – 2018-08-05 (×9): 5000 [IU] via SUBCUTANEOUS
  Filled 2018-08-02 (×9): qty 1

## 2018-08-02 NOTE — H&P (Signed)
History and Physical    Matthew Lozano BOF:[id number] DOB: 11/29/1950 DOA: 08/01/2018  PCP: Hulan Fess, MD   Patient coming from: Home   Chief Complaint: N/V   HPI: Matthew Lozano is a 68 y.o. male with medical history significant for type 2 diabetes mellitus and GERD, now presenting to the emergency department for evaluation of nausea with copious nonbloody vomiting.  Patient reports he been in his usual state of health until Friday when he developed nausea with recurrent nonbloody vomiting.  This continued to worsen and he had 40 episodes of vomiting yesterday.  He had a couple loose stools yesterday.  He denies abdominal pain per se, but reports today generalized abdominal discomfort described as "burning."  He denies any recent travel or antibiotic use, but reports exposure to a coworker who was suffering from a very similar GI illness.  Symptoms have been improving, but he feels very dehydrated which prompted his presentation to the ED tonight.  He denies fevers, flank pain, or dysuria.  ED Course: Upon arrival to the ED, patient is found to be afebrile, saturating well on room air, tachycardic to 110, and with stable blood pressure.  Chemistry panel is notable for sodium of 132, bicarbonate 11, glucose 356, total bilirubin 2.2, and creatinine 1.95, up from 0.8 in 2018.  CBC is notable for leukocytosis to 18,200 with hemoglobin of 19.2.  Patient was given 2 L normal saline and Zofran in the ED.  Tachycardia resolved, blood pressure remained stable, and the patient will be observed for ongoing evaluation and management.  Review of Systems:  All other systems reviewed and apart from HPI, are negative.  Past Medical History:  Diagnosis Date  . GERD (gastroesophageal reflux disease)   . Hemorrhoids   . MVP (mitral valve prolapse)   . Type II diabetes mellitus ([place]) dx'd 2008  . Viral meningitis 2001    Past Surgical History:  Procedure Laterality Date  . LEFT HEART CATH AND CORONARY  ANGIOGRAPHY N/A 01/11/2017   Procedure: LEFT HEART CATH AND CORONARY ANGIOGRAPHY;  Surgeon: Martinique, Peter M, MD;  Location: [facility];  Service: Cardiovascular;  Laterality: N/A;  . LIPOMA EXCISION  2001   OFF BACK      reports that he quit smoking about 33 years ago. His smoking use included cigarettes. He has a 5.00 pack-year smoking history. He has never used smokeless tobacco. He reports current alcohol use. He reports current drug use. Drug: Marijuana.  Allergies  Allergen Reactions  . Metformin And Related Diarrhea  . Androgel [Testosterone] Rash  . Codeine Other (See Comments)    Reaction:  GI upset   . Phenergan [Promethazine Hcl] Diarrhea    Family History  Problem Relation Age of Onset  . CVA Father   . Other Brother        AIDS     Prior to Admission medications   Medication Sig Start Date End Date Taking? Authorizing Provider  aspirin 81 MG EC tablet Take 1 tablet (81 mg total) by mouth daily. 01/03/17   Theora Gianotti, NP  canagliflozin (INVOKANA) 300 MG TABS tablet Take 300 mg by mouth daily before breakfast.    [provider]  meclizine (ANTIVERT) 32 MG tablet Take 32 mg by mouth 3 (three) times daily as needed for dizziness.    [provider]    Physical Exam: Vitals:   08/01/18 2129 08/02/18 0106  BP: (!) 149/72   Pulse: (!) 111   Resp: 17  Temp: 97.7 F (36.5 C)   TempSrc: Oral   SpO2: 97%   Weight:  83.9 kg  Height:  '5\' 11"'  (1.803 m)    Constitutional: NAD, calm  Eyes: PERTLA, lids and conjunctivae normal ENMT: Mucous membranes are moist. Posterior pharynx clear of any exudate or lesions.   Neck: normal, supple, no masses, no thyromegaly Respiratory: clear to auscultation bilaterally, no wheezing, no crackles. Normal respiratory effort.    Cardiovascular: S1 & S2 heard, regular rate and rhythm. No extremity edema.   Abdomen: No distension, no tenderness, soft. Bowel sounds active.  Musculoskeletal: no  clubbing / cyanosis. No joint deformity upper and lower extremities.    Skin: no significant rashes, lesions, ulcers. Poor turgor. Neurologic: CN 2-12 grossly intact. Sensation intact. Strength 5/5 in all 4 limbs.  Psychiatric: Alert and oriented x 3. Calm, cooperative.    Labs on Admission: I have personally reviewed following labs and imaging studies  CBC: Recent Labs  Lab 08/01/18 2209 08/02/18 0146  WBC 18.2*  --   HGB 19.1* 18.4*  HCT 54.2* 54.0*  MCV 90.3  --   PLT 255  --    Basic Metabolic Panel: Recent Labs  Lab 08/01/18 2209 08/02/18 0146  NA 132* 129*  K 4.4 4.5  CL 96*  --   CO2 11*  --   GLUCOSE 356*  --   BUN 29*  --   CREATININE 1.95*  --   CALCIUM 10.6*  --    GFR: Estimated Creatinine Clearance: 39.2 mL/min (A) (by C-G formula based on SCr of 1.95 mg/dL (H)). Liver Function Tests: Recent Labs  Lab 08/01/18 2209  AST 21  ALT 19  ALKPHOS 90  BILITOT 2.2*  PROT 8.4*  ALBUMIN 4.9   Recent Labs  Lab 08/01/18 2209  LIPASE 43   No results for input(s): AMMONIA in the last 168 hours. Coagulation Profile: No results for input(s): INR, PROTIME in the last 168 hours. Cardiac Enzymes: No results for input(s): CKTOTAL, CKMB, CKMBINDEX, TROPONINI in the last 168 hours. BNP (last 3 results) No results for input(s): PROBNP in the last 8760 hours. HbA1C: No results for input(s): HGBA1C in the last 72 hours. CBG: No results for input(s): GLUCAP in the last 168 hours. Lipid Profile: No results for input(s): CHOL, HDL, LDLCALC, TRIG, CHOLHDL, LDLDIRECT in the last 72 hours. Thyroid Function Tests: No results for input(s): TSH, T4TOTAL, FREET4, T3FREE, THYROIDAB in the last 72 hours. Anemia Panel: No results for input(s): VITAMINB12, FOLATE, FERRITIN, TIBC, IRON, RETICCTPCT in the last 72 hours. Urine analysis:    Component Value Date/Time   COLORURINE YELLOW 07/01/2016 0730   APPEARANCEUR CLEAR 07/01/2016 0730   LABSPEC 1.029 07/01/2016 0730    PHURINE 5.0 07/01/2016 0730   GLUCOSEU >=500 (A) 07/01/2016 0730   HGBUR NEGATIVE 07/01/2016 0730   BILIRUBINUR NEGATIVE 07/01/2016 0730   KETONESUR 80 (A) 07/01/2016 0730   PROTEINUR 30 (A) 07/01/2016 0730   NITRITE NEGATIVE 07/01/2016 0730   LEUKOCYTESUR NEGATIVE 07/01/2016 0730   Sepsis Labs: '@LABRCNTIP' (procalcitonin:4,lacticidven:4) )No results found for this or any previous visit (from the past 240 hour(s)).   Radiological Exams on Admission: No results found.  EKG: Not performed.   Assessment/Plan   1. Acute gastroenteritis with dehydration  - Presents with 5 days of N/V with as many as 40 episodes yesterday, improved today with no vomiting since arrival in ED 5 hrs ago; he had a couple loose stools yesterday  - He denies recent travel or antibiotics,  but reports exposure to coworker who was having a similar illness  - Abdominal exam is benign - Likely acute viral gastroenteritis  - He tolerates oral liquids in ED, but with AKI and electrolyte derangements, will continue IVF hydration and supportive care while monitoring renal function and electrolytes    2. Acute kidney injury  - SCr is 1.95 on admission, up from 0.8 in 2018  - Likely prerenal azotemia in setting of recent N/V  - Check urine chemistries and renal US, renally-dose medications, avoid nephrotoxins, and repeat chem panel in am   3. Hyponatremia  - Serum sodium is 132 on admission in setting of hyperglycemia and hypovolemia  - Fluid-resuscitated with 2 liters NS in ED  - Continue IVF hydration and glycemic-control   - Repeat chem panel in am    4. Hyperbilirubinemia  - Total bilirubin is 2.2 on admission with normal transaminases, lipase, and alk phos  - Check abd [place], fractionate bilirubin    5. Type II DM  - A1c was 7.7% in 2018  - Med-rec pending, taking Invokana or Jardiance at home - Check CBG's and use a SSI with Novolog for now    DVT prophylaxis: sq heparin  Code Status: Full  Family  Communication: Discussed with patient   Consults called: None Admission status: Observation     Vianne Bulls, MD [facility] Pager [phone]  If 7PM-7AM, please contact night-coverage [email or web address] Password [email or web address]  08/02/2018, 2:20 AM

## 2018-08-02 NOTE — Progress Notes (Signed)
Received from ER- a/o x4 no vomiting at this time- presented with hiccups- [id number] upon arrival Orintated to room and call light- denies pain at this time

## 2018-08-02 NOTE — Plan of Care (Signed)
  Problem: Fluid Volume: Goal: Will show no signs and symptoms of excessive bleeding Outcome: Progressing   

## 2018-08-02 NOTE — Progress Notes (Signed)
[facility] Progress Note  Patient: Matthew Lozano QPR:[id number]   PCP: Hulan Fess, MD DOB: 1950/12/17   DOA: 08/01/2018   DOS: 08/02/2018   Date of Service: the patient was seen and examined on 08/02/2018  Brief hospital course: Pt. with PMH of type 2 diabetes mellitus and GERD; admitted on 08/01/2018, presented with complaint of nausea and vomiting and diarrhea, was found to have viral gastroenteritis. Currently further plan is continue IV supportive measures.  Subjective: Still reports nausea.  No further vomiting since yesterday.  No further bowel movement since last night.  No abdominal pain as well.  No fever no chills.  Assessment and Plan: 1. Acute gastroenteritis with dehydration  - Presents with 5 days of N/V with as many as 40 episodes yesterday, improved today with no vomiting couple loose stools yesterday  - He denies recent travel or antibiotics, but reports exposure to coworker who was having a similar illness  - Abdominal exam is benign - Likely acute viral gastroenteritis  - He tolerates oral liquids in ED, but with AKI and electrolyte derangements, will continue IVF hydration and supportive care while monitoring renal function and electrolytes    2. Acute kidney injury  - SCr is 1.95 on admission, up from 0.8 in 2018  - Likely prerenal azotemia in setting of recent N/V  continue IV fluids.  3. Hyponatremia  - Serum sodium is 132 on admission in setting of hyperglycemia and hypovolemia  - Continue IVF hydration and glycemic-control   - Repeat chem panel in am    4. Hyperbilirubinemia  - Total bilirubin is 2.2 on admission with normal transaminases, lipase, and alk phos  - Ultrasound abdomen shows fatty steatosis.  No other acute abnormality.  Monitor with hydration.  5. Type II DM  controlled with hyperglycemia. - A1c was 7.7% in 2018  -  taking Invokana or Jardiance at home - Check CBG's and use a SSI with Novolog for now   Diet: Clear liquid diet DVT  Prophylaxis: subcutaneous Heparin  Advance goals of care discussion: full code  Family Communication: no family was present at bedside, at the time of interview.   Disposition:  Discharge to home.  Consultants: none Procedures: noen  Scheduled Meds: . heparin  5,000 Units Subcutaneous Q8H  . insulin aspart  0-9 Units Subcutaneous Q4H   Continuous Infusions: . sodium chloride 125 mL/hr at 08/02/18 0957   PRN Meds: acetaminophen **OR** acetaminophen, ondansetron **OR** ondansetron (ZOFRAN) IV, traMADol Antibiotics: Anti-infectives (From admission, onward)   None       Objective: Physical Exam: Vitals:   08/01/18 2129 08/02/18 0106 08/02/18 0348 08/02/18 1236  BP: (!) 149/72  140/74 123/65  Pulse: (!) 111  83 82  Resp: '17  15 18  ' Temp: 97.7 F (36.5 C)  97.7 F (36.5 C) 98.4 F (36.9 C)  TempSrc: Oral  Oral Oral  SpO2: 97%  100% 100%  Weight:  83.9 kg 77.5 kg   Height:  '5\' 11"'  (1.803 m)      Intake/Output Summary (Last 24 hours) at 08/02/2018 1736 Last data filed at 08/02/2018 0656 Gross per 24 hour  Intake 1003 ml  Output 1200 ml  Net -197 ml   Filed Weights   08/02/18 0106 08/02/18 0348  Weight: 83.9 kg 77.5 kg   General: Alert, Awake and Oriented to Time, Place and Person. Appear in mild distress, affect appropriate Eyes: PERRL, Conjunctiva normal ENT: Oral Mucosa clear moist. Neck: no JVD, no Abnormal Mass Or lumps  Cardiovascular: S1 and S2 Present, no Murmur, Peripheral Pulses Present Respiratory: normal respiratory effort, Bilateral Air entry equal and Decreased, no use of accessory muscle, Clear to Auscultation, no Crackles, no wheezes Abdomen: Bowel Sound present, Soft and mild tenderness, no hernia Skin: no redness, no Rash, no induration Extremities: nono Pedal edema, no calf tenderness Neurologic: Grossly no focal neuro deficit. Bilaterally Equal motor strength  Data Reviewed: CBC: Recent Labs  Lab 08/01/18 2209 08/02/18 0146 08/02/18 0238   WBC 18.2*  --  17.2*  NEUTROABS  --   --  14.9*  HGB 19.1* 18.4* 18.1*  HCT 54.2* 54.0* 50.8  MCV 90.3  --  90.7  PLT 255  --  616   Basic Metabolic Panel: Recent Labs  Lab 08/01/18 2209 08/02/18 0146 08/02/18 0238  NA 132* 129* 133*  K 4.4 4.5 4.5  CL 96*  --  100  CO2 11*  --  14*  GLUCOSE 356*  --  318*  BUN 29*  --  28*  CREATININE 1.95*  --  1.82*  CALCIUM 10.6*  --  10.0    Liver Function Tests: Recent Labs  Lab 08/01/18 2209 08/02/18 0238  AST 21 17  ALT 19 18  ALKPHOS 90 78  BILITOT 2.2* 2.0*  PROT 8.4* 7.2  ALBUMIN 4.9 4.4   Recent Labs  Lab 08/01/18 2209  LIPASE 43   No results for input(s): AMMONIA in the last 168 hours. Coagulation Profile: No results for input(s): INR, PROTIME in the last 168 hours. Cardiac Enzymes: No results for input(s): CKTOTAL, CKMB, CKMBINDEX, TROPONINI in the last 168 hours. BNP (last 3 results) No results for input(s): PROBNP in the last 8760 hours. CBG: Recent Labs  Lab 08/02/18 0354 08/02/18 0839 08/02/18 1234 08/02/18 1723  GLUCAP 275* 188* 174* 178*   Studies: US Abdomen Complete  Result Date: 08/02/2018 CLINICAL DATA:  68 y/o M; hyperbilirubinemia and acute kidney injury. EXAM: ABDOMEN ULTRASOUND COMPLETE COMPARISON:  None. FINDINGS: Gallbladder: No gallstones or wall thickening visualized. No sonographic Murphy sign noted by sonographer. Common bile duct: Diameter: Mm Liver: Mildly increased liver echogenicity. No focal liver lesion identified. Portal vein is patent on color Doppler imaging with normal direction of blood flow towards the liver. IVC: No abnormality visualized. Pancreas: Visualized portion unremarkable. Spleen: Size and appearance within normal limits. Right Kidney: Length: 10.6 cm. Echogenicity within normal limits. No mass or hydronephrosis visualized. Left Kidney: Length: 11.4 cm. Echogenicity within normal limits. Simple 1.2 cm peripelvic cyst. No hydronephrosis. Abdominal aorta: No aneurysm  visualized. Other findings: None. IMPRESSION: Mildly increased liver echogenicity compatible with steatosis. Otherwise unremarkable abdominal ultrasound. Electronically Signed   By: Kristine Garbe M.D.   On: 08/02/2018 05:57     Time spent: 35 minutes  Author: Berle Mull, MD [facility] 08/02/2018 5:36 PM  To reach On-call, see care teams to locate the attending and reach out to them via [email or web address]. If 7PM-7AM, please contact night-coverage If you still have difficulty reaching the attending provider, please page the [facility] (Director on Call) for [facility] on amion for assistance.

## 2018-08-02 NOTE — ED Notes (Signed)
ED TO INPATIENT HANDOFF REPORT  ED Nurse Name and Phone #: Richarda Osmond Name/Age/Gender Matthew Lozano 68 y.o. male Room/Bed: 015C/015C  Code Status   Code Status: Full Code  Home/SNF/Other Home Patient oriented to: self, place, time and situation Is this baseline? Yes   Triage Complete: Triage complete  Chief Complaint Vomiting    Triage Note Pt reports generalized abd pain and vomiting since Friday. Pt reports multiple vomiting episodes today. Pt denies any diarrhea.    Allergies Allergies  Allergen Reactions  . Metformin And Related Diarrhea  . Androgel [Testosterone] Rash  . Codeine Other (See Comments)    Reaction:  GI upset   . Phenergan [Promethazine Hcl] Diarrhea    Level of Care/Admitting Diagnosis ED Disposition    ED Disposition Condition Comment   Admit  Hospital Area: [facility] [[id number]]  Level of Care: Med-Surg [16]  I expect the patient will be discharged within 24 hours: Yes  LOW acuity---Tx typically complete <24 hrs---ACUTE conditions typically can be evaluated <24 hours---LABS likely to return to acceptable levels <24 hours---IS near functional baseline---EXPECTED to return to current living arrangement---NOT newly hypoxic: Does not meet criteria for 5C-Observation unit  Diagnosis: Acute gastroenteritis [035465]  Admitting Physician: Briscoe Deutscher [[id number]]  Attending Physician: Briscoe Deutscher [[id number]]  PT Class (Do Not Modify): Observation [104]  PT Acc Code (Do Not Modify): Observation [10022]       B Medical/Surgery History Past Medical History:  Diagnosis Date  . GERD (gastroesophageal reflux disease)   . Hemorrhoids   . MVP (mitral valve prolapse)   . Type II diabetes mellitus ([facility]) dx'd 2008  . Viral meningitis 2001   Past Surgical History:  Procedure Laterality Date  . LEFT HEART CATH AND CORONARY ANGIOGRAPHY N/A 01/11/2017   Procedure: LEFT HEART CATH AND CORONARY ANGIOGRAPHY;  Surgeon: [place], Peter M,  MD;  Location: [facility] INVASIVE [facility];  Service: Cardiovascular;  Laterality: N/A;  . LIPOMA EXCISION  2001   OFF BACK      A IV Location/Drains/Wounds Patient Lines/Drains/Airways Status   Active Line/Drains/Airways    Name:   Placement date:   Placement time:   Site:   Days:   Peripheral IV 08/02/18 Left Antecubital   08/02/18    0130    Antecubital   less than 1          Intake/Output Last 24 hours  Intake/Output Summary (Last 24 hours) at 08/02/2018 0313 Last data filed at 08/02/2018 0139 Gross per 24 hour  Intake 3 ml  Output -  Net 3 ml    Labs/Imaging Results for orders placed or performed during the hospital encounter of 08/01/18 (from the past 48 hour(s))  Lipase, blood     Status: None   Collection Time: 08/01/18 10:09 PM  Result Value Ref Range   Lipase 43 11 - 51 U/L    Comment: Performed at [facility], [address], [place], [place] [address]  Comprehensive metabolic panel     Status: Abnormal   Collection Time: 08/01/18 10:09 PM  Result Value Ref Range   Sodium 132 (L) 135 - 145 mmol/L   Potassium 4.4 3.5 - 5.1 mmol/L   Chloride 96 (L) 98 - 111 mmol/L   CO2 11 (L) 22 - 32 mmol/L   Glucose, Bld 356 (H) 70 - 99 mg/dL   BUN 29 (H) 8 - 23 mg/dL   Creatinine, Ser 5.91 (H) 0.61 - 1.24 mg/dL  Calcium 10.6 (H) 8.9 - 10.3 mg/dL   Total Protein 8.4 (H) 6.5 - 8.1 g/dL   Albumin 4.9 3.5 - 5.0 g/dL   AST 21 15 - 41 U/L   ALT 19 0 - 44 U/L   Alkaline Phosphatase 90 38 - 126 U/L   Total Bilirubin 2.2 (H) 0.3 - 1.2 mg/dL   GFR calc non Af Amer 35 (L) >60 mL/min   GFR calc Af Amer 40 (L) >60 mL/min   Anion gap 25 (H) 5 - 15    Comment: Performed at [facility], [address], [place], [place] [address]  CBC     Status: Abnormal   Collection Time: 08/01/18 10:09 PM  Result Value Ref Range   WBC 18.2 (H) 4.0 - 10.5 K/uL   RBC 6.00 (H) 4.22 - 5.81 MIL/uL   Hemoglobin 19.1 (H) 13.0 - 17.0 g/dL   HCT 75.6 (H) 43.3 - 29.5 %   MCV 90.3 80.0 - 100.0 fL    MCH 31.8 26.0 - 34.0 pg   MCHC 35.2 30.0 - 36.0 g/dL   RDW 18.8 41.6 - 60.6 %   Platelets 255 150 - 400 K/uL   nRBC 0.0 0.0 - 0.2 %    Comment: Performed at [facility], [address] [place], [place] [address]  POCT I-Stat EG7     Status: Abnormal   Collection Time: 08/02/18  1:46 AM  Result Value Ref Range   pH, Ven 7.267 7.250 - 7.430   pCO2, Ven 25.4 (L) 44.0 - 60.0 mmHg   pO2, Ven 28.0 (LL) 32.0 - 45.0 mmHg   Bicarbonate 11.6 (L) 20.0 - 28.0 mmol/L   TCO2 12 (L) 22 - 32 mmol/L   O2 Saturation 47.0 %   Acid-base deficit 13.0 (H) 0.0 - 2.0 mmol/L   Sodium 129 (L) 135 - 145 mmol/L   Potassium 4.5 3.5 - 5.1 mmol/L   Calcium, Ion 1.35 1.15 - 1.40 mmol/L   HCT 54.0 (H) 39.0 - 52.0 %   Hemoglobin 18.4 (H) 13.0 - 17.0 g/dL   Patient temperature HIDE    Sample type VENOUS    Comment NOTIFIED PHYSICIAN   CBC WITH DIFFERENTIAL     Status: Abnormal   Collection Time: 08/02/18  2:38 AM  Result Value Ref Range   WBC 17.2 (H) 4.0 - 10.5 K/uL   RBC 5.60 4.22 - 5.81 MIL/uL   Hemoglobin 18.1 (H) 13.0 - 17.0 g/dL   HCT 10.9 32.3 - 55.7 %   MCV 90.7 80.0 - 100.0 fL   MCH 32.3 26.0 - 34.0 pg   MCHC 35.6 30.0 - 36.0 g/dL   RDW 32.2 02.5 - 42.7 %   Platelets 223 150 - 400 K/uL   nRBC 0.0 0.0 - 0.2 %   Neutrophils Relative % 87 %   Neutro Abs 14.9 (H) 1.7 - 7.7 K/uL   Lymphocytes Relative 5 %   Lymphs Abs 0.9 0.7 - 4.0 K/uL   Monocytes Relative 7 %   Monocytes Absolute 1.2 (H) 0.1 - 1.0 K/uL   Eosinophils Relative 0 %   Eosinophils Absolute 0.0 0.0 - 0.5 K/uL   Basophils Relative 0 %   Basophils Absolute 0.0 0.0 - 0.1 K/uL   Immature Granulocytes 1 %   Abs Immature Granulocytes 0.14 (H) 0.00 - 0.07 K/uL    Comment: Performed at [facility], [address], [place], [place] [address]   No results found.  Pending  Labs [facility] CorporationUnresulted Labs (From admission, onward)    Start     Ordered   08/02/18 0500  HIV antibody (Routine Testing)  Tomorrow morning,   R     08/02/18  0219   08/02/18 0500  Comprehensive metabolic panel  Tomorrow morning,   R     08/02/18 0219   08/02/18 0220  Sodium, urine, random  Once,   R     08/02/18 0219   08/02/18 0220  Creatinine, urine, random  Once,   R     08/02/18 0219   08/01/18 2201  Urinalysis, Routine w reflex microscopic  ONCE - STAT,   STAT     08/01/18 2200          Vitals/Pain Today's Vitals   08/01/18 2129 08/01/18 2159 08/02/18 0106 08/02/18 0106  BP: (!) 149/72     Pulse: (!) 111     Resp: 17     Temp: 97.7 F (36.5 C)     TempSrc: Oral     SpO2: 97%     Weight:    83.9 kg  Height:    5\' 11"  (1.803 m)  PainSc:  9  0-No pain     Isolation Precautions No active isolations  Medications Medications  sodium chloride 0.9 % bolus 1,000 mL (has no administration in time range)  insulin aspart (novoLOG) injection 0-9 Units (has no administration in time range)  heparin injection 5,000 Units (has no administration in time range)  0.9 %  sodium chloride infusion (has no administration in time range)  acetaminophen (TYLENOL) tablet 650 mg (has no administration in time range)    Or  acetaminophen (TYLENOL) suppository 650 mg (has no administration in time range)  ondansetron (ZOFRAN) tablet 4 mg (has no administration in time range)    Or  ondansetron (ZOFRAN) injection 4 mg (has no administration in time range)  fentaNYL (SUBLIMAZE) injection 25-50 mcg (has no administration in time range)  sodium chloride flush (NS) 0.9 % injection 3 mL (3 mLs Intravenous Given 08/02/18 0139)  ondansetron (ZOFRAN-ODT) disintegrating tablet 4 mg (4 mg Oral Given 08/01/18 2203)  ondansetron (ZOFRAN) injection 4 mg (4 mg Intravenous Given 08/02/18 0139)  sodium chloride 0.9 % bolus 1,000 mL (0 mLs Intravenous Stopped 08/02/18 [id number])    Mobility walks Low fall risk   Focused Assessments Cardiac Assessment Handoff:    Lab Results  Component Value Date   TROPONINI <0.03 01/01/2017   No results found for: DDIMER Does  the Patient currently have chest pain? No     R Recommendations: See Admitting Provider Note  Report given to:   Additional Notes: None

## 2018-08-02 NOTE — ED Provider Notes (Signed)
[facility] EMERGENCY DEPARTMENT Provider Note   CSN: [id number] Arrival date & time: 08/01/18  2127    History   Chief Complaint Chief Complaint  Patient presents with  . Emesis  . Abdominal Pain    HPI Matthew Lozano is a 68 y.o. male.     The history is provided by the patient and medical records.  Emesis  Associated symptoms: abdominal pain and diarrhea   Abdominal Pain  Associated symptoms: diarrhea, nausea and vomiting      68 y.o. M with hx of GERD, DM2, presenting to the ED with N/V/D and feeling dehydrated.  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  States he vomited numerous times on Friday and started feeling somewhat better over the weekend.  States yesterday he tried to eat some spinach which seemed to make things even worse.  He reports approx 40 episodes of nonbloody, nonbilious emesis since yesterday.  States now he just has the hiccups and feels very nauseated.  He did have some loose stools after eating the spinach but no other BM since that time.  He has not had any fevers or chills.  He denies any current abdominal pain, just feels "sore".  States since he has not been eating he has not taken his Jardiance for the past 2 days.    Past Medical History:  Diagnosis Date  . GERD (gastroesophageal reflux disease)   . Hemorrhoids   . MVP (mitral valve prolapse)   . Type II diabetes mellitus (HCC) dx'd 2008  . Viral meningitis 2001    Patient Active Problem List   Diagnosis Date Noted  . Dyslipidemia 01/21/2017  . 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 Male erectile dysfunction 01/10/2017  . 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  . Chest pain with moderate risk of acute coronary syndrome 01/02/2017  . Hemorrhage of anus and rectum 08/29/2014  . 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 Non-insulin dependent type 2 diabetes mellitus (HCC) 10/26/2013  . MVP (mitral valve prolapse)     Past Surgical History:  Procedure Laterality  Date  . LEFT HEART CATH AND CORONARY ANGIOGRAPHY N/A 01/11/2017   Procedure: LEFT HEART CATH AND CORONARY ANGIOGRAPHY;  Surgeon: [place], Peter M, MD;  Location: [facility] INVASIVE CV LAB;  Service: Cardiovascular;  Laterality: N/A;  . LIPOMA EXCISION  2001   OFF BACK         Home Medications    Prior to Admission medications   Medication Sig Start Date End Date Taking? Authorizing Provider  aspirin 81 MG EC tablet Take 1 tablet (81 mg total) by mouth daily. 01/03/17   Creig Hines, NP  canagliflozin (INVOKANA) 300 MG TABS tablet Take 300 mg by mouth daily before breakfast.    [provider]  meclizine (ANTIVERT) 32 MG tablet Take 32 mg by mouth 3 (three) times daily as needed for dizziness.    [provider]    Family History Family History  Problem Relation Age of Onset  . CVA Father   . Other Brother        AIDS    Social History Social History   Tobacco Use  . Smoking status: Former Smoker    Packs/day: 0.50    Years: 10.00    Pack years: 5.00    Types: Cigarettes    Last attempt to quit: 1987    Years since quitting: 33.2  . Smokeless tobacco: Never Used  Substance Use Topics  .  Alcohol use: Yes    Comment: 12/31/2016 "stopped 11/2016"  . Drug use: Yes    Types: Marijuana    Comment: 12/31/2016 "weekly"     Allergies   Metformin and related; Androgel [testosterone]; Codeine; and Phenergan [promethazine hcl]   Review of Systems Review of Systems  Gastrointestinal: Positive for abdominal pain, diarrhea, nausea and vomiting.  All other systems reviewed and are negative.    Physical Exam Updated Vital Signs BP (!) 149/72 (BP Location: Right Arm)   Pulse (!) 111   Temp 97.7 F (36.5 C) (Oral)   Resp 17   Ht  (1.803 m)   Wt 83.9 kg   SpO2 97%   BMI 25.80 kg/m   Physical Exam Vitals signs and nursing note reviewed.  Constitutional:      Appearance: He is well-developed.  HENT:     Head: Normocephalic and atraumatic.       Mouth/Throat:     Comments: Dry mucous membranes Eyes:     Conjunctiva/sclera: Conjunctivae normal.     Pupils: Pupils are equal, round, and reactive to light.  Neck:     Musculoskeletal: Normal range of motion.  Cardiovascular:     Rate and Rhythm: Normal rate and regular rhythm.     Heart sounds: Normal heart sounds.  Pulmonary:     Effort: Pulmonary effort is normal.     Breath sounds: Normal breath sounds.  Abdominal:     General: Bowel sounds are normal.     Palpations: Abdomen is soft.     Tenderness: There is no abdominal tenderness.     Comments: Normal bowel sounds, nontender, no peritoneal signs  Musculoskeletal: Normal range of motion.  Skin:    General: Skin is warm and dry.     Comments: Poor skin turgor  Neurological:     Mental Status: He is alert and oriented to person, place, and time.      ED Treatments / Results  Labs (all labs ordered are listed, but only abnormal results are displayed) Labs Reviewed  COMPREHENSIVE METABOLIC PANEL - Abnormal; Notable for the following components:      Result Value   Sodium 132 (*)    Chloride 96 (*)    CO2 11 (*)    Glucose, Bld 356 (*)    BUN 29 (*)    Creatinine, Ser 1.95 (*)    Calcium 10.6 (*)    Total Protein 8.4 (*)    Total Bilirubin 2.2 (*)    GFR calc non Af Amer 35 (*)    GFR calc Af Amer 40 (*)    Anion gap 25 (*)    All other components within normal limits  CBC - Abnormal; Notable for the following components:   WBC 18.2 (*)    RBC 6.00 (*)    Hemoglobin 19.1 (*)    HCT 54.2 (*)    All other components within normal limits  POCT I-STAT EG7 - Abnormal; Notable for the following components:   pCO2, Ven 25.4 (*)    pO2, Ven 28.0 (*)    Bicarbonate 11.6 (*)    TCO2 12 (*)    Acid-base deficit 13.0 (*)    Sodium 129 (*)    HCT 54.0 (*)    Hemoglobin 18.4 (*)    All other components within normal limits  LIPASE, BLOOD  URINALYSIS, ROUTINE W REFLEX MICROSCOPIC  HIV ANTIBODY (ROUTINE  TESTING W REFLEX)  COMPREHENSIVE METABOLIC PANEL  CBC WITH DIFFERENTIAL/PLATELET  SODIUM, URINE,  RANDOM  CREATININE, URINE, RANDOM  I-STAT VENOUS BLOOD GAS, ED    EKG None  Radiology No results found.  Procedures Procedures (including critical care time)  CRITICAL CARE Performed by: Garlon Hatchet   Total critical care time: 35 minutes  Critical care time was exclusive of separately billable procedures and treating other patients.  Critical care was necessary to treat or prevent imminent or life-threatening deterioration.  Critical care was time spent personally by me on the following activities: development of treatment plan with patient and/or surrogate as well as nursing, discussions with consultants, evaluation of patient's response to treatment, examination of patient, obtaining history from patient or surrogate, ordering and performing treatments and interventions, ordering and review of laboratory studies, ordering and review of radiographic studies, pulse oximetry and re-evaluation of patient's condition.   Medications Ordered in ED Medications  sodium chloride 0.9 % bolus 1,000 mL (has no administration in time range)  insulin aspart (novoLOG) injection 0-9 Units (has no administration in time range)  heparin injection 5,000 Units (has no administration in time range)  0.9 %  sodium chloride infusion (has no administration in time range)  acetaminophen (TYLENOL) tablet 650 mg (has no administration in time range)    Or  acetaminophen (TYLENOL) suppository 650 mg (has no administration in time range)  ondansetron (ZOFRAN) tablet 4 mg (has no administration in time range)    Or  ondansetron (ZOFRAN) injection 4 mg (has no administration in time range)  fentaNYL (SUBLIMAZE) injection 25-50 mcg (has no administration in time range)  sodium chloride flush (NS) 0.9 % injection 3 mL (3 mLs Intravenous Given 08/02/18 0139)  ondansetron (ZOFRAN-ODT) disintegrating tablet 4  mg (4 mg Oral Given 08/01/18 2203)  ondansetron (ZOFRAN) injection 4 mg (4 mg Intravenous Given 08/02/18 0139)  sodium chloride 0.9 % bolus 1,000 mL (1,000 mLs Intravenous New Bag/Given 08/02/18 0136)     Initial Impression / Assessment and Plan / ED Course  I have reviewed the triage vital signs and the nursing notes.  Pertinent labs & imaging results that were available during my care of the patient were reviewed by me and considered in my medical decision making (see chart for details).  68 year old male here with nausea, vomiting, and diarrhea along with feeling dehydrated.  Started getting sick on Friday which he reports he "picked up" from an employee.  Better over the weekend but worse again yesterday after trying to eat a lot of spinach.  He is afebrile and nontoxic in appearance here but does appear clinically dehydrated.  Mucous membranes are very dry and he has very poor skin turgor.  Patient's labs are grossly abnormal with several electrolyte derangements--AKI as well as low bicarb of 11 and increased anion gap to 25.  His sugar is 356, has not taken his Jardiance in 2 days due to no oral intake.  VBG with normal pH.  Suspect his symptoms likely related to dehydration rather than true DKA.  He is given 2 L fluid bolus and re-check chemistry.  He will require admission.  Discussed with Dr. Antionette Char-- will admit for ongoing care.  Final Clinical Impressions(s) / ED Diagnoses   Final diagnoses:  AKI (acute kidney injury) [facility])  Dehydration    ED Discharge Orders    None       Garlon Hatchet, PA-C 08/02/18 0243    Gilda Crease, MD 08/02/18 [phone]

## 2018-08-03 DIAGNOSIS — Z87891 Personal history of nicotine dependence: Secondary | ICD-10-CM | POA: Diagnosis not present

## 2018-08-03 DIAGNOSIS — Z823 Family history of stroke: Secondary | ICD-10-CM | POA: Diagnosis not present

## 2018-08-03 DIAGNOSIS — Z7982 Long term (current) use of aspirin: Secondary | ICD-10-CM | POA: Diagnosis not present

## 2018-08-03 DIAGNOSIS — K219 Gastro-esophageal reflux disease without esophagitis: Secondary | ICD-10-CM | POA: Diagnosis present

## 2018-08-03 DIAGNOSIS — E1165 Type 2 diabetes mellitus with hyperglycemia: Secondary | ICD-10-CM | POA: Diagnosis present

## 2018-08-03 DIAGNOSIS — E860 Dehydration: Secondary | ICD-10-CM | POA: Diagnosis present

## 2018-08-03 DIAGNOSIS — N179 Acute kidney failure, unspecified: Secondary | ICD-10-CM | POA: Diagnosis present

## 2018-08-03 DIAGNOSIS — Z79899 Other long term (current) drug therapy: Secondary | ICD-10-CM | POA: Diagnosis not present

## 2018-08-03 DIAGNOSIS — R112 Nausea with vomiting, unspecified: Secondary | ICD-10-CM | POA: Diagnosis not present

## 2018-08-03 DIAGNOSIS — E871 Hypo-osmolality and hyponatremia: Secondary | ICD-10-CM | POA: Diagnosis present

## 2018-08-03 DIAGNOSIS — Z888 Allergy status to other drugs, medicaments and biological substances status: Secondary | ICD-10-CM | POA: Diagnosis not present

## 2018-08-03 DIAGNOSIS — K529 Noninfective gastroenteritis and colitis, unspecified: Secondary | ICD-10-CM | POA: Diagnosis not present

## 2018-08-03 DIAGNOSIS — A084 Viral intestinal infection, unspecified: Secondary | ICD-10-CM | POA: Diagnosis present

## 2018-08-03 DIAGNOSIS — R17 Unspecified jaundice: Secondary | ICD-10-CM | POA: Diagnosis present

## 2018-08-03 DIAGNOSIS — Z79890 Hormone replacement therapy (postmenopausal): Secondary | ICD-10-CM | POA: Diagnosis not present

## 2018-08-03 LAB — GLUCOSE, CAPILLARY
Glucose-Capillary: 107 mg/dL — ABNORMAL HIGH (ref 70–99)
Glucose-Capillary: 117 mg/dL — ABNORMAL HIGH (ref 70–99)
Glucose-Capillary: 139 mg/dL — ABNORMAL HIGH (ref 70–99)
Glucose-Capillary: 158 mg/dL — ABNORMAL HIGH (ref 70–99)
Glucose-Capillary: 166 mg/dL — ABNORMAL HIGH (ref 70–99)
Glucose-Capillary: 178 mg/dL — ABNORMAL HIGH (ref 70–99)
Glucose-Capillary: 190 mg/dL — ABNORMAL HIGH (ref 70–99)

## 2018-08-03 LAB — BASIC METABOLIC PANEL
Anion gap: 11 (ref 5–15)
BUN: 15 mg/dL (ref 8–23)
CO2: 20 mmol/L — ABNORMAL LOW (ref 22–32)
Calcium: 8.3 mg/dL — ABNORMAL LOW (ref 8.9–10.3)
Chloride: 106 mmol/L (ref 98–111)
Creatinine, Ser: 1.09 mg/dL (ref 0.61–1.24)
GFR calc Af Amer: >60 mL/min (ref >60)
GFR calc non Af Amer: >60 mL/min (ref >60)
Glucose, Bld: 166 mg/dL — ABNORMAL HIGH (ref 70–99)
Potassium: 3.4 mmol/L — ABNORMAL LOW (ref 3.5–5.1)
Sodium: 137 mmol/L (ref 135–145)

## 2018-08-03 MED ORDER — INSULIN ASPART 100 UNIT/ML ~~LOC~~ SOLN
0.0000 [IU] | Freq: Three times a day (TID) | SUBCUTANEOUS | Status: DC
  Administered 2018-08-03: 1 [IU] via SUBCUTANEOUS
  Administered 2018-08-03 – 2018-08-05 (×5): 2 [IU] via SUBCUTANEOUS

## 2018-08-03 MED ORDER — DICYCLOMINE HCL 10 MG PO CAPS: 10.0000 mg | ORAL_CAPSULE | Freq: Three times a day (TID) | ORAL | Status: DC

## 2018-08-03 MED ORDER — ALUM & MAG HYDROXIDE-SIMETH 200-200-20 MG/5ML PO SUSP
30.0000 mL | ORAL | Status: DC | PRN
  Administered 2018-08-03 – 2018-08-05 (×7): 30 mL via ORAL
  Filled 2018-08-03 (×7): qty 30

## 2018-08-03 MED ORDER — INSULIN ASPART 100 UNIT/ML ~~LOC~~ SOLN: 0.0000 [IU] | Freq: Every day | SUBCUTANEOUS | Status: DC

## 2018-08-03 MED ORDER — DICYCLOMINE HCL 10 MG PO CAPS
10.0000 mg | ORAL_CAPSULE | Freq: Three times a day (TID) | ORAL | Status: DC | PRN
  Administered 2018-08-03 – 2018-08-04 (×3): 10 mg via ORAL
  Filled 2018-08-03 (×3): qty 1

## 2018-08-03 MED ORDER — SODIUM CHLORIDE 0.9 % IV BOLUS
500.0000 mL | Freq: Once | INTRAVENOUS | Status: AC
  Administered 2018-08-03: 500 mL via INTRAVENOUS

## 2018-08-03 NOTE — Plan of Care (Signed)
  Problem: Bowel/Gastric: Goal: Will show no signs and symptoms of gastrointestinal bleeding Outcome: Progressing   Problem: Clinical Measurements: Goal: Complications related to the disease process, condition or treatment will be avoided or minimized Outcome: Not Progressing   Problem: Education: Goal: Knowledge of General Education information will improve Description Including pain rating scale, medication(s)/side effects and non-pharmacologic comfort measures Outcome: Progressing

## 2018-08-03 NOTE — Progress Notes (Signed)
[facility] Progress Note  Patient: Matthew Lozano TML:[id number]   PCP: Hulan Fess, MD DOB: 07-01-1950   DOA: 08/01/2018   DOS: 08/03/2018   Date of Service: the patient was seen and examined on 08/03/2018  Brief hospital course: Pt. with PMH of type 2 diabetes mellitus and GERD; admitted on 08/01/2018, presented with complaint of nausea and vomiting and diarrhea, was found to have viral gastroenteritis. Currently further plan is continue IV supportive measures.  Subjective: No nausea but reports abdominal cramp while trying to eat.  No vomiting no bowel movement.  Passing gas.  Assessment and Plan: 1. Acute gastroenteritis with dehydration  - Presents with 5 days of N/V with as many as 40 episodes yesterday, improved today with no vomiting couple loose stools yesterday  - He denies recent travel or antibiotics, but reports exposure to coworker who was having a similar illness  - Abdominal exam is benign - Likely acute viral gastroenteritis  - Does not have any further nausea. We will add Bentyl as needed and advance to regular carb modified diet.  Continue with IV hydration.  2. Acute kidney injury  - SCr is 1.95 on admission, up from 0.8 in 2018  - Likely prerenal azotemia in setting of recent N/V  continue IV fluids.  3. Hyponatremia  - Serum sodium is 132 on admission in setting of hyperglycemia and hypovolemia  - Continue IVF hydration and glycemic-control   - Repeat chem panel in am    4. Hyperbilirubinemia  - Total bilirubin is 2.2 on admission with normal transaminases, lipase, and alk phos  - Ultrasound abdomen shows fatty steatosis.  No other acute abnormality.  Monitor with hydration.  5. Type II DM  controlled with hyperglycemia. - A1c was 7.7% in 2018  -  taking Invokana or Jardiance at home - Check CBG's and use a SSI with Novolog for now  transition from every 4 hours to a CHS.  Diet: Carb modified diet DVT Prophylaxis: subcutaneous Heparin  Advance  goals of care discussion: full code  Family Communication: no family was present at bedside, at the time of interview.   Disposition:  Discharge to home.  Consultants: none Procedures: noen  Scheduled Meds: . heparin  5,000 Units Subcutaneous Q8H  . insulin aspart  0-5 Units Subcutaneous QHS  . insulin aspart  0-9 Units Subcutaneous TID WC   Continuous Infusions: . sodium chloride 75 mL/hr at 08/03/18 1955   PRN Meds: acetaminophen **OR** acetaminophen, alum & mag hydroxide-simeth, dicyclomine, ondansetron **OR** ondansetron (ZOFRAN) IV, traMADol Antibiotics: Anti-infectives (From admission, onward)   None       Objective: Physical Exam: Vitals:   08/03/18 0400 08/03/18 0855 08/03/18 1444 08/03/18 1707  BP: 112/60 (!) 119/54 119/62 138/70  Pulse: 68 68 68 66  Resp: '16 18 17 18  ' Temp: 98.4 F (36.9 C) 97.7 F (36.5 C) 98 F (36.7 C) 98.4 F (36.9 C)  TempSrc: Oral Oral Oral Oral  SpO2: 97% 100% 100% 100%  Weight:      Height:        Intake/Output Summary (Last 24 hours) at 08/03/2018 2015 Last data filed at 08/03/2018 1603 Gross per 24 hour  Intake 600 ml  Output 1700 ml  Net -1100 ml   Filed Weights   08/02/18 0106 08/02/18 0348  Weight: 83.9 kg 77.5 kg   General: Alert, Awake and Oriented to Time, Place and Person. Appear in mild distress, affect appropriate Eyes: PERRL, Conjunctiva normal ENT: Oral Mucosa clear moist.  Neck: no JVD, no Abnormal Mass Or lumps Cardiovascular: S1 and S2 Present, no Murmur, Peripheral Pulses Present Respiratory: normal respiratory effort, Bilateral Air entry equal and Decreased, no use of accessory muscle, Clear to Auscultation, no Crackles, no wheezes Abdomen: Bowel Sound present, Soft and mild tenderness, no hernia Skin: no redness, no Rash, no induration Extremities: nono Pedal edema, no calf tenderness Neurologic: Grossly no focal neuro deficit. Bilaterally Equal motor strength  Data Reviewed: CBC: Recent Labs  Lab  08/01/18 2209 08/02/18 0146 08/02/18 0238  WBC 18.2*  --  17.2*  NEUTROABS  --   --  14.9*  HGB 19.1* 18.4* 18.1*  HCT 54.2* 54.0* 50.8  MCV 90.3  --  90.7  PLT 255  --  035   Basic Metabolic Panel: Recent Labs  Lab 08/01/18 2209 08/02/18 0146 08/02/18 0238 08/03/18 1128  NA 132* 129* 133* 137  K 4.4 4.5 4.5 3.4*  CL 96*  --  100 106  CO2 11*  --  14* 20*  GLUCOSE 356*  --  318* 166*  BUN 29*  --  28* 15  CREATININE 1.95*  --  1.82* 1.09  CALCIUM 10.6*  --  10.0 8.3*    Liver Function Tests: Recent Labs  Lab 08/01/18 2209 08/02/18 0238  AST 21 17  ALT 19 18  ALKPHOS 90 78  BILITOT 2.2* 2.0*  PROT 8.4* 7.2  ALBUMIN 4.9 4.4   Recent Labs  Lab 08/01/18 2209  LIPASE 43   No results for input(s): AMMONIA in the last 168 hours. Coagulation Profile: No results for input(s): INR, PROTIME in the last 168 hours. Cardiac Enzymes: No results for input(s): CKTOTAL, CKMB, CKMBINDEX, TROPONINI in the last 168 hours. BNP (last 3 results) No results for input(s): PROBNP in the last 8760 hours. CBG: Recent Labs  Lab 08/03/18 0401 08/03/18 0756 08/03/18 0852 08/03/18 1215 08/03/18 1619  GLUCAP 158* 190* 178* 166* 139*   Studies: No results found.   Time spent: 35 minutes  Author: Berle Mull, MD [facility] 08/03/2018 8:15 PM  To reach On-call, see care teams to locate the attending and reach out to them via [email or web address]. If 7PM-7AM, please contact night-coverage If you still have difficulty reaching the attending provider, please page the [facility] (Director on Call) for [facility] on amion for assistance.

## 2018-08-04 LAB — COMPREHENSIVE METABOLIC PANEL
ALT: 12 U/L (ref 0–44)
AST: 17 U/L (ref 15–41)
Albumin: 3.2 g/dL — ABNORMAL LOW (ref 3.5–5.0)
Alkaline Phosphatase: 52 U/L (ref 38–126)
Anion gap: 22 — ABNORMAL HIGH (ref 5–15)
BUN: 11 mg/dL (ref 8–23)
CO2: 17 mmol/L — ABNORMAL LOW (ref 22–32)
Calcium: 8.7 mg/dL — ABNORMAL LOW (ref 8.9–10.3)
Chloride: 101 mmol/L (ref 98–111)
Creatinine, Ser: 1.1 mg/dL (ref 0.61–1.24)
GFR calc Af Amer: >60 mL/min (ref >60)
GFR calc non Af Amer: >60 mL/min (ref >60)
Glucose, Bld: 182 mg/dL — ABNORMAL HIGH (ref 70–99)
Potassium: 3.2 mmol/L — ABNORMAL LOW (ref 3.5–5.1)
Sodium: 140 mmol/L (ref 135–145)
Total Bilirubin: 1.9 mg/dL — ABNORMAL HIGH (ref 0.3–1.2)
Total Protein: 5.3 g/dL — ABNORMAL LOW (ref 6.5–8.1)

## 2018-08-04 LAB — GLUCOSE, CAPILLARY
Glucose-Capillary: 187 mg/dL — ABNORMAL HIGH (ref 70–99)
Glucose-Capillary: 177 mg/dL — ABNORMAL HIGH (ref 70–99)
Glucose-Capillary: 161 mg/dL — ABNORMAL HIGH (ref 70–99)
Glucose-Capillary: 190 mg/dL — ABNORMAL HIGH (ref 70–99)

## 2018-08-04 LAB — CBC WITH DIFFERENTIAL/PLATELET
Abs Immature Granulocytes: 0.02 10*3/uL (ref 0.00–0.07)
Basophils Absolute: 0 10*3/uL (ref 0.0–0.1)
Basophils Relative: 1 %
Eosinophils Absolute: 0 10*3/uL (ref 0.0–0.5)
Eosinophils Relative: 0 %
HCT: 36.4 % — ABNORMAL LOW (ref 39.0–52.0)
Hemoglobin: 13.2 g/dL (ref 13.0–17.0)
Immature Granulocytes: 0 %
Lymphocytes Relative: 24 %
Lymphs Abs: 1.5 10*3/uL (ref 0.7–4.0)
MCH: 32.2 pg (ref 26.0–34.0)
MCHC: 36.3 g/dL — ABNORMAL HIGH (ref 30.0–36.0)
MCV: 88.8 fL (ref 80.0–100.0)
Monocytes Absolute: 0.6 10*3/uL (ref 0.1–1.0)
Monocytes Relative: 9 %
Neutro Abs: 4.1 10*3/uL (ref 1.7–7.7)
Neutrophils Relative %: 66 %
Platelets: 134 10*3/uL — ABNORMAL LOW (ref 150–400)
RBC: 4.1 MIL/uL — ABNORMAL LOW (ref 4.22–5.81)
RDW: 11.9 % (ref 11.5–15.5)
WBC: 6.3 10*3/uL (ref 4.0–10.5)
nRBC: 0 % (ref 0.0–0.2)

## 2018-08-04 LAB — MAGNESIUM: Magnesium: 2 mg/dL (ref 1.7–2.4)

## 2018-08-04 MED ORDER — FAMOTIDINE 20 MG PO TABS
20.0000 mg | ORAL_TABLET | Freq: Two times a day (BID) | ORAL | Status: DC
  Administered 2018-08-04 – 2018-08-05 (×3): 20 mg via ORAL
  Filled 2018-08-04 (×3): qty 1

## 2018-08-04 MED ORDER — POTASSIUM CHLORIDE CRYS ER 20 MEQ PO TBCR
40.0000 meq | EXTENDED_RELEASE_TABLET | Freq: Once | ORAL | Status: AC
  Administered 2018-08-04: 40 meq via ORAL
  Filled 2018-08-04: qty 2

## 2018-08-04 MED ORDER — ZOLPIDEM TARTRATE 5 MG PO TABS
5.0000 mg | ORAL_TABLET | Freq: Once | ORAL | Status: AC
  Administered 2018-08-04: 5 mg via ORAL
  Filled 2018-08-04: qty 1

## 2018-08-04 MED ORDER — FAMOTIDINE IN NACL 20-0.9 MG/50ML-% IV SOLN
20.0000 mg | Freq: Once | INTRAVENOUS | Status: AC
  Administered 2018-08-04: 20 mg via INTRAVENOUS
  Filled 2018-08-04: qty 50

## 2018-08-04 NOTE — Progress Notes (Signed)
[facility] Progress Note  Patient: Matthew Lozano QVZ:[id number]   PCP: Hulan Fess, MD DOB: 22-Sep-1950   DOA: 08/01/2018   DOS: 08/04/2018   Date of Service: the patient was seen and examined on 08/04/2018  Brief hospital course: Pt. with PMH of type 2 diabetes mellitus and GERD; admitted on 08/01/2018, presented with complaint of nausea and vomiting and diarrhea, was found to have viral gastroenteritis. Currently further plan is continue IV supportive measures.  Subjective: still abdominal pain.   Assessment and Plan: 1. Acute gastroenteritis with dehydration  - Presents with 5 days of N/V with as many as 40 episodes yesterday, improved today with no vomiting couple loose stools yesterday  - He denies recent travel or antibiotics, but reports exposure to coworker who was having a similar illness  - Abdominal exam is benign - Likely acute viral gastroenteritis  - Does not have any further nausea. - We will add Bentyl as needed and advance to regular carb modified diet.  Patient with minimal p.o. intake. I will hold IV hydration.  2. Acute kidney injury  - SCr is 1.95 on admission, up from 0.8 in 2018  - Likely prerenal azotemia in setting of recent N/V  continue IV fluids.  3. Hyponatremia  - Serum sodium is 132 on admission in setting of hyperglycemia and hypovolemia  - Continue IVF hydration and glycemic-control   - Repeat chem panel in am    4. Hyperbilirubinemia  - Total bilirubin is 2.2 on admission with normal transaminases, lipase, and alk phos  - Ultrasound abdomen shows fatty steatosis.  No other acute abnormality.  Monitor with hydration.  5. Type II DM  controlled with hyperglycemia. - A1c was 7.7% in 2018  -  taking Invokana or Jardiance at home - Check CBG's and use a SSI with Novolog for now  transition from every 4 hours to a CHS.  Diet: Carb modified diet DVT Prophylaxis: subcutaneous Heparin  Advance goals of care discussion: full code  Family  Communication: no family was present at bedside, at the time of interview.   Disposition:  Discharge to home.  Consultants: none Procedures: noen  Scheduled Meds: . famotidine  20 mg Oral BID  . heparin  5,000 Units Subcutaneous Q8H  . insulin aspart  0-5 Units Subcutaneous QHS  . insulin aspart  0-9 Units Subcutaneous TID WC   Continuous Infusions:  PRN Meds: acetaminophen **OR** acetaminophen, alum & mag hydroxide-simeth, dicyclomine, ondansetron **OR** ondansetron (ZOFRAN) IV, traMADol Antibiotics: Anti-infectives (From admission, onward)   None       Objective: Physical Exam: Vitals:   08/03/18 2111 08/04/18 0422 08/04/18 0500 08/04/18 1225  BP: 130/63 138/75  (!) 120/59  Pulse: 61 63  (!) 58  Resp: '18 14  18  ' Temp: 98.5 F (36.9 C) 98.1 F (36.7 C)  98.6 F (37 C)  TempSrc: Oral Oral  Oral  SpO2: 100% 97%  99%  Weight:   81.5 kg   Height:        Intake/Output Summary (Last 24 hours) at 08/04/2018 1343 Last data filed at 08/04/2018 0845 Gross per 24 hour  Intake -  Output 2150 ml  Net -2150 ml   Filed Weights   08/02/18 0106 08/02/18 0348 08/04/18 0500  Weight: 83.9 kg 77.5 kg 81.5 kg   General: Alert, Awake and Oriented to Time, Place and Person. Appear in mild distress, affect appropriate Eyes: PERRL, Conjunctiva normal ENT: Oral Mucosa clear moist. Neck: no JVD, no Abnormal Mass Or  lumps Cardiovascular: S1 and S2 Present, no Murmur, Peripheral Pulses Present Respiratory: normal respiratory effort, Bilateral Air entry equal and Decreased, no use of accessory muscle, Clear to Auscultation, no Crackles, no wheezes Abdomen: Bowel Sound present, Soft and mild tenderness, no hernia Skin: no redness, no Rash, no induration Extremities: nono Pedal edema, no calf tenderness Neurologic: Grossly no focal neuro deficit. Bilaterally Equal motor strength  Data Reviewed: CBC: Recent Labs  Lab 08/01/18 2209 08/02/18 0146 08/02/18 0238 08/04/18 0606  WBC  18.2*  --  17.2* 6.3  NEUTROABS  --   --  14.9* 4.1  HGB 19.1* 18.4* 18.1* 13.2  HCT 54.2* 54.0* 50.8 36.4*  MCV 90.3  --  90.7 88.8  PLT 255  --  223 161*   Basic Metabolic Panel: Recent Labs  Lab 08/01/18 2209 08/02/18 0146 08/02/18 0238 08/03/18 1128 08/04/18 0234  NA 132* 129* 133* 137 140  K 4.4 4.5 4.5 3.4* 3.2*  CL 96*  --  100 106 101  CO2 11*  --  14* 20* 17*  GLUCOSE 356*  --  318* 166* 182*  BUN 29*  --  28* 15 11  CREATININE 1.95*  --  1.82* 1.09 1.10  CALCIUM 10.6*  --  10.0 8.3* 8.7*  MG  --   --   --   --  2.0    Liver Function Tests: Recent Labs  Lab 08/01/18 2209 08/02/18 0238 08/04/18 0234  AST '21 17 17  ' ALT '19 18 12  ' ALKPHOS 90 78 52  BILITOT 2.2* 2.0* 1.9*  PROT 8.4* 7.2 5.3*  ALBUMIN 4.9 4.4 3.2*   Recent Labs  Lab 08/01/18 2209  LIPASE 43   No results for input(s): AMMONIA in the last 168 hours. Coagulation Profile: No results for input(s): INR, PROTIME in the last 168 hours. Cardiac Enzymes: No results for input(s): CKTOTAL, CKMB, CKMBINDEX, TROPONINI in the last 168 hours. BNP (last 3 results) No results for input(s): PROBNP in the last 8760 hours. CBG: Recent Labs  Lab 08/03/18 1215 08/03/18 1619 08/03/18 2109 08/04/18 0755 08/04/18 1200  GLUCAP 166* 139* 117* 187* 177*   Studies: No results found.   Time spent: 35 minutes  Author: Berle Mull, MD [facility] 08/04/2018 1:43 PM  To reach On-call, see care teams to locate the attending and reach out to them via [email or web address]. If 7PM-7AM, please contact night-coverage If you still have difficulty reaching the attending provider, please page the [facility] (Director on Call) for [facility] on amion for assistance.

## 2018-08-05 LAB — BASIC METABOLIC PANEL
Anion gap: 10 (ref 5–15)
BUN: 10 mg/dL (ref 8–23)
CO2: 22 mmol/L (ref 22–32)
Calcium: 8.3 mg/dL — ABNORMAL LOW (ref 8.9–10.3)
Chloride: 104 mmol/L (ref 98–111)
Creatinine, Ser: 0.93 mg/dL (ref 0.61–1.24)
GFR calc Af Amer: >60 mL/min (ref >60)
GFR calc non Af Amer: >60 mL/min (ref >60)
Glucose, Bld: 204 mg/dL — ABNORMAL HIGH (ref 70–99)
Potassium: 3.3 mmol/L — ABNORMAL LOW (ref 3.5–5.1)
Sodium: 136 mmol/L (ref 135–145)

## 2018-08-05 LAB — GLUCOSE, CAPILLARY
Glucose-Capillary: 174 mg/dL — ABNORMAL HIGH (ref 70–99)
Glucose-Capillary: 176 mg/dL — ABNORMAL HIGH (ref 70–99)
Glucose-Capillary: 195 mg/dL — ABNORMAL HIGH (ref 70–99)

## 2018-08-05 MED ORDER — FAMOTIDINE 20 MG PO TABS: 20.0000 mg | ORAL_TABLET | Freq: Two times a day (BID) | ORAL | 0 refills | Status: DC

## 2018-08-05 MED ORDER — ALUM & MAG HYDROXIDE-SIMETH 200-200-20 MG/5ML PO SUSP: 30.0000 mL | ORAL | 0 refills | Status: DC | PRN

## 2018-08-05 MED ORDER — ONDANSETRON HCL 4 MG PO TABS: 4.0000 mg | ORAL_TABLET | Freq: Four times a day (QID) | ORAL | 0 refills | Status: DC | PRN

## 2018-08-05 NOTE — Progress Notes (Signed)
Pt given oral and written discharge instructions. IV removed without difficulty and pt tolerated well.Assisted to vehicle by NT.

## 2018-08-05 NOTE — Plan of Care (Signed)
  Problem: Health Behavior/Discharge Planning: Goal: Ability to manage health-related needs will improve Outcome: Progressing   Problem: Clinical Measurements: Goal: Complications related to the disease process, condition or treatment will be avoided or minimized Outcome: Progressing   Problem: Bowel/Gastric: Goal: Will show no signs and symptoms of gastrointestinal bleeding Outcome: Progressing

## 2018-08-05 NOTE — Discharge Summary (Signed)
Physician Discharge Summary [facility]   Patient: Matthew Lozano                   Admit date: 08/01/2018   DOB: 1950/09/29             Discharge date:08/05/2018/10:42 AM [id number]                           PCP: Hulan Fess, MD  Recommendations for Outpatient Follow-up:   . Follow up: as needed  Discharge Condition: Stable   Code Status:   Code Status: Full Code  Diet recommendation: Diabetic diet   Discharge Diagnoses:    Principal Problem:   Acute gastroenteritis Active Problems:   Uncontrolled diabetes mellitus with hyperglycemia ([place])   AKI (acute kidney injury) ([place])   Hyponatremia   Leukocytosis   Intractable nausea and vomiting   History of Present Illness/ Hospital Course Kathleen Argue Summary:    CREWE HEATHMAN is a 68 y.o. male with medical history significant of   Brief hospital course: Pt. with PMH of type 2 diabetes mellitus and GERD; admitted on 08/01/2018, presented with complaint of nausea and vomiting and diarrhea, was found to have viral gastroenteritis. Currently further plan is continue IV supportive measures.    D/C summery / Assessment and Plan: Acute gastroenteritis with dehydration -Presents with 5 days of N/V with as many as 40 episodes yesterday, improved today with no vomiting couple loose stools yesterday -He denies recent travel or antibiotics, but reports exposure to coworker who was having a similar illness -Abdominal exam is benign -Likely acute viral gastroenteritis -Does not have any further nausea. -Status post IV fluid hydration, now tolerating p.o.  Acute kidney injury -SCr is 1.95 on admission, up from 0.8 in 2018>>>> creatinine 0.93 today -Likely prerenal azotemia in setting of recent N/V  status post continue IV fluids.  Hyponatremia -Serum sodium is 132 on admission in setting of hyperglycemia and hypovolemia -Status post IVF hydration and glycemic-control -Resolved sodium 136 today   Hyperbilirubinemia -Total bilirubin is 2.2 on admission with normal transaminases, lipase, and alk phos -Ultrasound abdomen shows fatty steatosis.  No other acute abnormality. -Proved  Type II DM controlled with hyperglycemia. -A1c was 7.7% in 2018 - taking Invokana or Jardiance at home -Check CBG's and use a SSI with Novolog for now transition from every 4 hours to a CHS.  Diet: Carb modified diet  Advance goals of care discussion: full code Family Communication: no family was present at bedside, at the time of interview.   Disposition:  Discharge to home.       Discharge Instructions:   Discharge Instructions    Activity as tolerated - No restrictions   Complete by:  As directed    Diet Carb Modified   Complete by:  As directed    Discharge instructions   Complete by:  As directed    Advance your diet slowly...   Increase activity slowly   Complete by:  As directed        Medication List    TAKE these medications   alum & mag hydroxide-simeth 200-200-20 MG/5ML suspension Commonly known as:  MAALOX/MYLANTA Take 30 mLs by mouth every 4 (four) hours as needed for indigestion.   famotidine 20 MG tablet Commonly known as:  PEPCID Take 1 tablet (20 mg total) by mouth 2 (two) times daily for 30 days.   Jardiance 25 MG Tabs tablet Generic drug:  empagliflozin  Take 25 mg by mouth daily.   ondansetron 4 MG tablet Commonly known as:  ZOFRAN Take 1 tablet (4 mg total) by mouth every 6 (six) hours as needed for nausea.   pioglitazone 15 MG tablet Commonly known as:  ACTOS Take 15 mg by mouth every morning.       Allergies  Allergen Reactions  . Metformin And Related Diarrhea  . Androgel [Testosterone] Rash  . Codeine Other (See Comments)    Reaction:  GI upset   . Phenergan [Promethazine Hcl] Diarrhea     Procedures /Studies:   US Abdomen Complete  Result Date: 08/02/2018 CLINICAL DATA:  68 y/o M; hyperbilirubinemia and acute kidney  injury. EXAM: ABDOMEN ULTRASOUND COMPLETE COMPARISON:  None. FINDINGS: Gallbladder: No gallstones or wall thickening visualized. No sonographic Murphy sign noted by sonographer. Common bile duct: Diameter: Mm Liver: Mildly increased liver echogenicity. No focal liver lesion identified. Portal vein is patent on color Doppler imaging with normal direction of blood flow towards the liver. IVC: No abnormality visualized. Pancreas: Visualized portion unremarkable. Spleen: Size and appearance within normal limits. Right Kidney: Length: 10.6 cm. Echogenicity within normal limits. No mass or hydronephrosis visualized. Left Kidney: Length: 11.4 cm. Echogenicity within normal limits. Simple 1.2 cm peripelvic cyst. No hydronephrosis. Abdominal aorta: No aneurysm visualized. Other findings: None. IMPRESSION: Mildly increased liver echogenicity compatible with steatosis. Otherwise unremarkable abdominal ultrasound. Electronically Signed   By: Kristine Garbe M.D.   On: 08/02/2018 05:57     Subjective:   Patient was seen and examined 08/05/2018, 10:42 AM Patient stable today. No acute distress.  No issues overnight Stable for discharge.  Discharge Exam:    Vitals:   08/04/18 0500 08/04/18 1225 08/04/18 2058 08/05/18 0335  BP:  (!) 120/59 (!) 141/64 132/67  Pulse:  (!) 58 (!) 56 (!) 58  Resp:  '18 15 16  ' Temp:  98.6 F (37 C) 98.3 F (36.8 C) 98.5 F (36.9 C)  TempSrc:  Oral Oral Oral  SpO2:  99% 99% 98%  Weight: 81.5 kg     Height:        General: Pt lying comfortably in bed & appears in no obvious distress. Cardiovascular: S1 & S2 heard, RRR, S1/S2 +. No murmurs, rubs, gallops or clicks. No JVD or pedal edema. Respiratory: Clear to auscultation without wheezing, rhonchi or crackles. No increased work of breathing. Abdominal:  Non-distended, non-tender & soft. No organomegaly or masses appreciated. Normal bowel sounds heard. CNS: Alert and oriented. No focal deficits. Extremities: no edema,  no cyanosis    The results of significant diagnostics from this hospitalization (including imaging, microbiology, ancillary and laboratory) are listed below for reference.      Microbiology:   No results found for this or any previous visit (from the past 240 hour(s)).   Labs:   CBC: Recent Labs  Lab 08/01/18 2209 08/02/18 0146 08/02/18 0238 08/04/18 0606  WBC 18.2*  --  17.2* 6.3  NEUTROABS  --   --  14.9* 4.1  HGB 19.1* 18.4* 18.1* 13.2  HCT 54.2* 54.0* 50.8 36.4*  MCV 90.3  --  90.7 88.8  PLT 255  --  223 161*   Basic Metabolic Panel: Recent Labs  Lab 08/01/18 2209 08/02/18 0146 08/02/18 0238 08/03/18 1128 08/04/18 0234 08/05/18 0916  NA 132* 129* 133* 137 140 136  K 4.4 4.5 4.5 3.4* 3.2* 3.3*  CL 96*  --  100 106 101 104  CO2 11*  --  14* 20* 17*  22  GLUCOSE 356*  --  318* 166* 182* 204*  BUN 29*  --  28* '15 11 10  ' CREATININE 1.95*  --  1.82* 1.09 1.10 0.93  CALCIUM 10.6*  --  10.0 8.3* 8.7* 8.3*  MG  --   --   --   --  2.0  --    Liver Function Tests: Recent Labs  Lab 08/01/18 2209 08/02/18 0238 08/04/18 0234  AST '21 17 17  ' ALT '19 18 12  ' ALKPHOS 90 78 52  BILITOT 2.2* 2.0* 1.9*  PROT 8.4* 7.2 5.3*  ALBUMIN 4.9 4.4 3.2*   BNP (last 3 results) No results for input(s): BNP in the last 8760 hours. Cardiac Enzymes: No results for input(s): CKTOTAL, CKMB, CKMBINDEX, TROPONINI in the last 168 hours. CBG: Recent Labs  Lab 08/04/18 1200 08/04/18 1655 08/04/18 2059 08/05/18 0002 08/05/18 0747  GLUCAP 177* 161* 190* 176* 174*   Hgb A1c No results for input(s): HGBA1C in the last 72 hours. Lipid Profile No results for input(s): CHOL, HDL, LDLCALC, TRIG, CHOLHDL, LDLDIRECT in the last 72 hours. Thyroid function studies No results for input(s): TSH, T4TOTAL, T3FREE, THYROIDAB in the last 72 hours.  Invalid input(s): FREET3 Anemia work up No results for input(s): VITAMINB12, FOLATE, FERRITIN, TIBC, IRON, RETICCTPCT in the last 72 hours.  Urinalysis    Component Value Date/Time   COLORURINE STRAW (A) 08/02/2018 0316   APPEARANCEUR CLEAR 08/02/2018 0316   LABSPEC 1.022 08/02/2018 0316   PHURINE 5.0 08/02/2018 0316   GLUCOSEU >=500 (A) 08/02/2018 0316   HGBUR SMALL (A) 08/02/2018 0316   BILIRUBINUR NEGATIVE 08/02/2018 0316   KETONESUR 80 (A) 08/02/2018 0316   PROTEINUR 30 (A) 08/02/2018 0316   NITRITE NEGATIVE 08/02/2018 0316   LEUKOCYTESUR NEGATIVE 08/02/2018 0316    Time coordinating discharge: Over 30 minutes  SIGNED: Deatra James, MD, FACP, FHM. [facility],  Pager [phone]  If 7PM-7AM, please contact night-coverage [email or web address] Ota [facility] 08/05/2018, 10:42 AM

## 2018-08-05 NOTE — Plan of Care (Signed)

## 2019-03-31 DIAGNOSIS — R1032 Left lower quadrant pain: Secondary | ICD-10-CM | POA: Diagnosis not present

## 2019-03-31 DIAGNOSIS — E119 Type 2 diabetes mellitus without complications: Secondary | ICD-10-CM | POA: Diagnosis not present

## 2019-04-01 ENCOUNTER — Emergency Department (HOSPITAL_COMMUNITY): Payer: Medicare Other

## 2019-04-01 ENCOUNTER — Encounter (HOSPITAL_COMMUNITY): Payer: Self-pay | Admitting: *Deleted

## 2019-04-01 ENCOUNTER — Other Ambulatory Visit: Payer: Self-pay

## 2019-04-01 ENCOUNTER — Emergency Department (HOSPITAL_COMMUNITY)
Admission: EM | Admit: 2019-04-01 | Discharge: 2019-04-01 | Disposition: A | Discharge status: Home / Self Care | Payer: Medicare Other | Attending: Emergency Medicine | Admitting: Emergency Medicine

## 2019-04-01 DIAGNOSIS — Z7984 Long term (current) use of oral hypoglycemic drugs: Secondary | ICD-10-CM | POA: Diagnosis not present

## 2019-04-01 DIAGNOSIS — E119 Type 2 diabetes mellitus without complications: Secondary | ICD-10-CM | POA: Insufficient documentation

## 2019-04-01 DIAGNOSIS — I2510 Atherosclerotic heart disease of native coronary artery without angina pectoris: Secondary | ICD-10-CM | POA: Diagnosis not present

## 2019-04-01 DIAGNOSIS — F1210 Cannabis abuse, uncomplicated: Secondary | ICD-10-CM | POA: Diagnosis not present

## 2019-04-01 DIAGNOSIS — Z87891 Personal history of nicotine dependence: Secondary | ICD-10-CM | POA: Insufficient documentation

## 2019-04-01 DIAGNOSIS — R1032 Left lower quadrant pain: Secondary | ICD-10-CM

## 2019-04-01 LAB — URINALYSIS, ROUTINE W REFLEX MICROSCOPIC
Bilirubin Urine: NEGATIVE
Glucose, UA: >=500 mg/dL — AB
Ketones, ur: 80 mg/dL — AB
Leukocytes,Ua: NEGATIVE
Nitrite: NEGATIVE
Protein, ur: NEGATIVE mg/dL
Specific Gravity, Urine: 1.03 (ref 1.005–1.030)
pH: 5 (ref 5.0–8.0)

## 2019-04-01 LAB — CBC WITH DIFFERENTIAL/PLATELET
Abs Immature Granulocytes: 0.04 10*3/uL (ref 0.00–0.07)
Basophils Absolute: 0.1 10*3/uL (ref 0.0–0.1)
Basophils Relative: 1 %
Eosinophils Absolute: 0.1 10*3/uL (ref 0.0–0.5)
Eosinophils Relative: 1 %
HCT: 45.1 % (ref 39.0–52.0)
Hemoglobin: 15.8 g/dL (ref 13.0–17.0)
Immature Granulocytes: 0 %
Lymphocytes Relative: 19 %
Lymphs Abs: 1.9 10*3/uL (ref 0.7–4.0)
MCH: 31.5 pg (ref 26.0–34.0)
MCHC: 35 g/dL (ref 30.0–36.0)
MCV: 90 fL (ref 80.0–100.0)
Monocytes Absolute: 0.5 10*3/uL (ref 0.1–1.0)
Monocytes Relative: 5 %
Neutro Abs: 7.5 10*3/uL (ref 1.7–7.7)
Neutrophils Relative %: 74 %
Platelets: 180 10*3/uL (ref 150–400)
RBC: 5.01 MIL/uL (ref 4.22–5.81)
RDW: 12.3 % (ref 11.5–15.5)
WBC: 10.1 10*3/uL (ref 4.0–10.5)
nRBC: 0 % (ref 0.0–0.2)

## 2019-04-01 LAB — COMPREHENSIVE METABOLIC PANEL
ALT: 20 U/L (ref 0–44)
AST: 21 U/L (ref 15–41)
Albumin: 4.2 g/dL (ref 3.5–5.0)
Alkaline Phosphatase: 65 U/L (ref 38–126)
Anion gap: 17 — ABNORMAL HIGH (ref 5–15)
BUN: 17 mg/dL (ref 8–23)
CO2: 14 mmol/L — ABNORMAL LOW (ref 22–32)
Calcium: 8.7 mg/dL — ABNORMAL LOW (ref 8.9–10.3)
Chloride: 106 mmol/L (ref 98–111)
Creatinine, Ser: 0.69 mg/dL (ref 0.61–1.24)
GFR calc Af Amer: >60 mL/min (ref >60)
GFR calc non Af Amer: >60 mL/min (ref >60)
Glucose, Bld: 95 mg/dL (ref 70–99)
Potassium: 3.9 mmol/L (ref 3.5–5.1)
Sodium: 137 mmol/L (ref 135–145)
Total Bilirubin: 1.5 mg/dL — ABNORMAL HIGH (ref 0.3–1.2)
Total Protein: 7 g/dL (ref 6.5–8.1)

## 2019-04-01 LAB — CBG MONITORING, ED: Glucose-Capillary: 102 mg/dL — ABNORMAL HIGH (ref 70–99)

## 2019-04-01 LAB — LIPASE, BLOOD: Lipase: 107 U/L — ABNORMAL HIGH (ref 11–51)

## 2019-04-01 MED ORDER — FENTANYL CITRATE (PF) 100 MCG/2ML IJ SOLN
50.0000 ug | Freq: Once | INTRAMUSCULAR | Status: AC
  Administered 2019-04-01: 50 ug via INTRAVENOUS
  Filled 2019-04-01: qty 2

## 2019-04-01 MED ORDER — IOHEXOL 300 MG/ML  SOLN
100.0000 mL | Freq: Once | INTRAMUSCULAR | Status: AC | PRN
  Administered 2019-04-01: 100 mL via INTRAVENOUS

## 2019-04-01 MED ORDER — SODIUM CHLORIDE (PF) 0.9 % IJ SOLN
INTRAMUSCULAR | Status: AC
  Filled 2019-04-01: qty 50

## 2019-04-01 MED ORDER — SODIUM CHLORIDE 0.9 % IV BOLUS
1000.0000 mL | Freq: Once | INTRAVENOUS | Status: AC
  Administered 2019-04-01: 13:00:00 1000 mL via INTRAVENOUS

## 2019-04-01 NOTE — ED Provider Notes (Signed)
[facility]-EMERGENCY DEPT Provider Note   CSN: [id number] Arrival date & time: 04/01/19  1119     History   Chief Complaint Chief Complaint  Patient presents with  . Lower left abd pain    HPI Matthew Lozano is a 68 y.o. male.     The history is provided by the patient.  Abdominal Pain Pain location:  LLQ Pain quality: aching   Pain radiates to:  L leg and back Pain severity:  Moderate Onset quality:  Gradual Duration:  2 days Timing:  Intermittent Progression:  Waxing and waning Chronicity:  New Context: not previous surgeries   Relieved by:  Nothing Worsened by:  Nothing Associated symptoms: no anorexia, no belching, no chest pain, no chills, no constipation, no cough, no dysuria, no fever, no hematuria, no nausea, no shortness of breath, no sore throat, no vaginal discharge and no vomiting   Risk factors: no alcohol abuse and has not had multiple surgeries     Past Medical History:  Diagnosis Date  . GERD (gastroesophageal reflux disease)   . Hemorrhoids   . MVP (mitral valve prolapse)   . Type II diabetes mellitus ([facility]) dx'd 2008  . Viral meningitis 2001    Patient Active Problem List   Diagnosis Date Noted  . Intractable nausea and vomiting 08/03/2018  . Acute gastroenteritis 08/02/2018  . AKI (acute kidney injury) ([facility]) 08/02/2018  . Hyponatremia 08/02/2018  . Leukocytosis 08/02/2018  . Hyperbilirubinemia   . Dyslipidemia 01/21/2017  . 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 Male erectile dysfunction 01/10/2017  . 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  . Chest pain with moderate risk of acute coronary syndrome 01/02/2017  . Hemorrhage of anus and rectum 08/29/2014  . 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 Uncontrolled diabetes mellitus with hyperglycemia (H[facility]) 10/26/2013  . MVP (mitral valve prolapse)     Past Surgical History:  Procedure Laterality Date  . LEFT HEART CATH AND CORONARY ANGIOGRAPHY N/A 01/11/2017   Procedure: LEFT  HEART CATH AND CORONARY ANGIOGRAPHY;  Surgeon: SwazilandJordan, Peter M, MD;  [facility];  Service: Cardiovascular;  Laterality: N/A;  . LIPOMA EXCISION  2001   OFF BACK         Home Medications    Prior to Admission medications   Medication Sig Start Date End Date Taking? Authorizing Provider  bismuth subsalicylate (PEPTO BISMOL) 262 MG chewable tablet Chew 524 mg by mouth as needed for indigestion or diarrhea or loose stools.   Yes [provider]  ibuprofen (ADVIL) 200 MG tablet Take 400 mg by mouth every 6 (six) hours as needed for fever, headache or moderate pain.   Yes [provider]  JARDIANCE 25 MG TABS tablet Take 25 mg by mouth daily. 04/26/18  Yes [provider]  alum & mag hydroxide-simeth (MAALOX/MYLANTA) 200-200-20 MG/5ML suspension Take 30 mLs by mouth every 4 (four) hours as needed for indigestion. Patient not taking: Reported on 04/01/2019 08/05/18   Kendell BaneShahmehdi, Seyed A, MD  famotidine (PEPCID) 20 MG tablet Take 1 tablet (20 mg total) by mouth 2 (two) times daily for 30 days. 08/05/18 09/04/18  Shahmehdi, Gemma PayorSeyed A, MD  ondansetron (ZOFRAN) 4 MG tablet Take 1 tablet (4 mg total) by mouth every 6 (six) hours as needed for nausea. Patient not taking: Reported on 04/01/2019 08/05/18   Kendell BaneShahmehdi, Seyed A, MD    Family History Family History  Problem Relation Age of Onset  . CVA Father   . Other Brother  AIDS    Social History Social History   Tobacco Use  . Smoking status: Former Smoker    Packs/day: 0.50    Years: 10.00    Pack years: 5.00    Types: Cigarettes    Quit date: 1987    Years since quitting: 33.8  . Smokeless tobacco: Never Used  Substance Use Topics  . Alcohol use: Yes    Alcohol/week: 2.0 - 3.0 standard drinks    Types: 2 - 3 Cans of beer per week    Comment: 12/31/2016 "stopped 11/2016"  . Drug use: Yes    Types: Marijuana    Comment: 12/31/2016 "weekly"     Allergies   Metformin and related, Androgel  [testosterone], Codeine, and Phenergan [promethazine hcl]   Review of Systems Review of Systems  Constitutional: Negative for chills and fever.  HENT: Negative for ear pain and sore throat.   Eyes: Negative for pain and visual disturbance.  Respiratory: Negative for cough and shortness of breath.   Cardiovascular: Negative for chest pain and palpitations.  Gastrointestinal: Positive for abdominal pain. Negative for anorexia, constipation, nausea and vomiting.  Genitourinary: Negative for dysuria, hematuria and vaginal discharge.  Musculoskeletal: Positive for back pain. Negative for arthralgias.  Skin: Negative for color change and rash.  Neurological: Negative for seizures and syncope.  All other systems reviewed and are negative.    Physical Exam Updated Vital Signs  ED Triage Vitals  Enc Vitals Group     BP 04/01/19 1127 (!) 144/76     Pulse Rate 04/01/19 1127 80     Resp 04/01/19 1127 16     Temp 04/01/19 1127 97.8 F (36.6 C)     Temp Source 04/01/19 1127 Oral     SpO2 04/01/19 1127 100 %     Weight 04/01/19 1135 170 lb (77.1 kg)     Height 04/01/19 1135  (1.803 m)     Head Circumference --      Peak Flow --      Pain Score 04/01/19 1134 5     Pain Loc --      Pain Edu? --      Excl. in GC? --     Physical Exam Vitals signs and nursing note reviewed.  Constitutional:      Appearance: He is well-developed.  HENT:     Head: Normocephalic and atraumatic.     Nose: Nose normal.     Mouth/Throat:     Mouth: Mucous membranes are moist.  Eyes:     Extraocular Movements: Extraocular movements intact.     Conjunctiva/sclera: Conjunctivae normal.     Pupils: Pupils are equal, round, and reactive to light.  Neck:     Musculoskeletal: Normal range of motion and neck supple.  Cardiovascular:     Rate and Rhythm: Normal rate and regular rhythm.     Pulses: Normal pulses.     Heart sounds: Normal heart sounds. No murmur.  Pulmonary:     Effort: Pulmonary  effort is normal. No respiratory distress.     Breath sounds: Normal breath sounds.  Abdominal:     General: There is no distension.     Palpations: Abdomen is soft. There is no mass.     Tenderness: There is abdominal tenderness (TTP to LLQ). There is no guarding or rebound.     Hernia: No hernia is present.  Genitourinary:    Penis: Normal.      Scrotum/Testes: Normal.  Musculoskeletal:  General: No tenderness.     Comments: No CVA tenderness  Skin:    General: Skin is warm and dry.     Capillary Refill: Capillary refill takes less than 2 seconds.  Neurological:     General: No focal deficit present.     Mental Status: He is alert.  Psychiatric:        Mood and Affect: Mood normal.      ED Treatments / Results  Labs (all labs ordered are listed, but only abnormal results are displayed) Labs Reviewed  COMPREHENSIVE METABOLIC PANEL - Abnormal; Notable for the following components:      Result Value   CO2 14 (*)    Calcium 8.7 (*)    Total Bilirubin 1.5 (*)    Anion gap 17 (*)    All other components within normal limits  LIPASE, BLOOD - Abnormal; Notable for the following components:   Lipase 107 (*)    All other components within normal limits  URINALYSIS, ROUTINE W REFLEX MICROSCOPIC - Abnormal; Notable for the following components:   Glucose, UA >=500 (*)    Hgb urine dipstick SMALL (*)    Ketones, ur 80 (*)    Bacteria, UA RARE (*)    All other components within normal limits  CBG MONITORING, ED - Abnormal; Notable for the following components:   Glucose-Capillary 102 (*)    All other components within normal limits  CBC WITH DIFFERENTIAL/PLATELET    EKG None  Radiology Ct Abdomen Pelvis W Contrast  Result Date: 04/01/2019 CLINICAL DATA:  Pt states he developed LLQ on Friday, PCP sent him to ED today for evaluation. CBG 102 in triage 100ML ISOVUE 300^161mL OMNIPAQUE IOHEXOL 300 MG/ML SOLN EXAM: CT ABDOMEN AND PELVIS WITH CONTRAST TECHNIQUE:  Multidetector CT imaging of the abdomen and pelvis was performed using the standard protocol following bolus administration of intravenous contrast. CONTRAST:  136mL OMNIPAQUE IOHEXOL 300 MG/ML  SOLN COMPARISON:  CT abdomen pelvis 07/01/2016 FINDINGS: Lower chest: Minimal opacities in the anterior right middle lobe, likely atelectasis. No pleural or pericardial effusion. Hepatobiliary: No focal liver abnormality is seen. No gallstones, gallbladder wall thickening, or biliary dilatation. Pancreas: Unremarkable. No pancreatic ductal dilatation or surrounding inflammatory changes. Spleen: Normal in size without focal abnormality. Adrenals/Urinary Tract: Adrenal glands are unremarkable. Kidneys are normal, without renal calculi, focal lesion, or hydronephrosis. Bladder is unremarkable. Stomach/Bowel: Stomach is within normal limits. Appendix appears normal. No evidence of bowel wall thickening, distention, or inflammatory changes. Sigmoid diverticula without evidence of diverticulitis. Vascular/Lymphatic: Mild aortoiliac atherosclerotic calcification without evidence of aneurysm. No lymphadenopathy. Reproductive: Prostate is unremarkable. Other: No abdominal wall hernia or abnormality. No abdominopelvic ascites. Musculoskeletal: Degenerative disc disease at L5-S1. IMPRESSION: No acute intra-abdominal pathology. Electronically Signed   By: Audie Pinto M.D.   On: 04/01/2019 14:16    Procedures Procedures (including critical care time)  Medications Ordered in ED Medications  sodium chloride (PF) 0.9 % injection (has no administration in time range)  sodium chloride 0.9 % bolus 1,000 mL (1,000 mLs Intravenous New Bag/Given 04/01/19 1246)  fentaNYL (SUBLIMAZE) injection 50 mcg (50 mcg Intravenous Given 04/01/19 1246)  iohexol (OMNIPAQUE) 300 MG/ML solution 100 mL (100 mLs Intravenous Contrast Given 04/01/19 1336)     Initial Impression / Assessment and Plan / ED Course  I have reviewed the triage vital  signs and the nursing notes.  Pertinent labs & imaging results that were available during my care of the patient were reviewed by me and considered in  my medical decision making (see chart for details).     DAK SZUMSKI is a 68 year old male with history of diabetes, reflux who presents to the ED with left lower quadrant abdominal pain.  Patient normal vitals.  No fever.  Pain for the last 2 days.  States that it goes into his low back and groin.  Testicular exam is overall unremarkable.  Has some tenderness in the left lower quadrant on exam.  Does not have any specific flank pain or back pain or midline spinal pain.  Concern for diverticulitis versus UTI versus MSK pain.  We will get basic labs, IV fentanyl, IV fluids.  Will obtain CT scan to evaluate for intra-abdominal process.  Possibly could be kidney stones.  Lab work showed no significant anemia, electrolyte abnormality, kidney injury.  Awaiting CT scan.  Urinalysis showed no signs of infection.  CT scan showed no diverticulitis, no bowel obstruction.  Overall unremarkable CT scan.  Lipase is mildly elevated however pancreas is normal on CT scan.  Likely patient with musculoskeletal type pain.  Recommend Tylenol, Motrin.  Recommend home stretches.  Recommend follow-up with primary care doctor. Given return precautions.  This chart was dictated using voice recognition software.  Despite best efforts to proofread,  errors can occur which can change the documentation meaning.    Final Clinical Impressions(s) / ED Diagnoses   Final diagnoses:  Left lower quadrant abdominal pain    ED Discharge Orders    None       Virgina Norfolk, DO 04/01/19 1434

## 2019-04-01 NOTE — Discharge Instructions (Addendum)
Use 1000 mg every 6 hours and 600 mg of Motrin every 8 hours for pain over the next several days.  Follow-up with your primary care doctor if pain does not improve.

## 2019-04-01 NOTE — ED Triage Notes (Addendum)
Pt states he developed LLQ on Friday, PCP sent him to ED today for evaluation. CBG 102 in triage

## 2019-04-03 ENCOUNTER — Encounter (HOSPITAL_COMMUNITY): Payer: Self-pay | Admitting: Emergency Medicine

## 2019-04-03 ENCOUNTER — Other Ambulatory Visit: Payer: Self-pay

## 2019-04-03 ENCOUNTER — Emergency Department (HOSPITAL_COMMUNITY): Payer: Medicare Other

## 2019-04-03 ENCOUNTER — Emergency Department (HOSPITAL_COMMUNITY)
Admission: EM | Admit: 2019-04-03 | Discharge: 2019-04-03 | Disposition: A | Discharge status: Home / Self Care | Payer: Medicare Other | Attending: Emergency Medicine | Admitting: Emergency Medicine

## 2019-04-03 DIAGNOSIS — E119 Type 2 diabetes mellitus without complications: Secondary | ICD-10-CM | POA: Insufficient documentation

## 2019-04-03 DIAGNOSIS — K529 Noninfective gastroenteritis and colitis, unspecified: Secondary | ICD-10-CM | POA: Insufficient documentation

## 2019-04-03 DIAGNOSIS — R1032 Left lower quadrant pain: Secondary | ICD-10-CM | POA: Diagnosis present

## 2019-04-03 DIAGNOSIS — Z87891 Personal history of nicotine dependence: Secondary | ICD-10-CM | POA: Diagnosis not present

## 2019-04-03 DIAGNOSIS — I259 Chronic ischemic heart disease, unspecified: Secondary | ICD-10-CM | POA: Diagnosis not present

## 2019-04-03 DIAGNOSIS — K5730 Diverticulosis of large intestine without perforation or abscess without bleeding: Secondary | ICD-10-CM | POA: Diagnosis not present

## 2019-04-03 LAB — COMPREHENSIVE METABOLIC PANEL
ALT: 18 U/L (ref 0–44)
AST: 17 U/L (ref 15–41)
Albumin: 4.3 g/dL (ref 3.5–5.0)
Alkaline Phosphatase: 69 U/L (ref 38–126)
Anion gap: 11 (ref 5–15)
BUN: 10 mg/dL (ref 8–23)
CO2: 19 mmol/L — ABNORMAL LOW (ref 22–32)
Calcium: 9 mg/dL (ref 8.9–10.3)
Chloride: 108 mmol/L (ref 98–111)
Creatinine, Ser: 0.69 mg/dL (ref 0.61–1.24)
GFR calc Af Amer: >60 mL/min (ref >60)
GFR calc non Af Amer: >60 mL/min (ref >60)
Glucose, Bld: 188 mg/dL — ABNORMAL HIGH (ref 70–99)
Potassium: 3.4 mmol/L — ABNORMAL LOW (ref 3.5–5.1)
Sodium: 138 mmol/L (ref 135–145)
Total Bilirubin: 1 mg/dL (ref 0.3–1.2)
Total Protein: 6.7 g/dL (ref 6.5–8.1)

## 2019-04-03 LAB — CBC WITH DIFFERENTIAL/PLATELET
Abs Immature Granulocytes: 0.04 10*3/uL (ref 0.00–0.07)
Basophils Absolute: 0.1 10*3/uL (ref 0.0–0.1)
Basophils Relative: 1 %
Eosinophils Absolute: 0.1 10*3/uL (ref 0.0–0.5)
Eosinophils Relative: 1 %
HCT: 43.1 % (ref 39.0–52.0)
Hemoglobin: 15.6 g/dL (ref 13.0–17.0)
Immature Granulocytes: 1 %
Lymphocytes Relative: 25 %
Lymphs Abs: 2 10*3/uL (ref 0.7–4.0)
MCH: 32.2 pg (ref 26.0–34.0)
MCHC: 36.2 g/dL — ABNORMAL HIGH (ref 30.0–36.0)
MCV: 88.9 fL (ref 80.0–100.0)
Monocytes Absolute: 0.6 10*3/uL (ref 0.1–1.0)
Monocytes Relative: 8 %
Neutro Abs: 5.2 10*3/uL (ref 1.7–7.7)
Neutrophils Relative %: 64 %
Platelets: 170 10*3/uL (ref 150–400)
RBC: 4.85 MIL/uL (ref 4.22–5.81)
RDW: 12.2 % (ref 11.5–15.5)
WBC: 8 10*3/uL (ref 4.0–10.5)
nRBC: 0 % (ref 0.0–0.2)

## 2019-04-03 LAB — LIPASE, BLOOD: Lipase: 25 U/L (ref 11–51)

## 2019-04-03 MED ORDER — IOHEXOL 300 MG/ML  SOLN
100.0000 mL | Freq: Once | INTRAMUSCULAR | Status: AC | PRN
  Administered 2019-04-03: 100 mL via INTRAVENOUS

## 2019-04-03 MED ORDER — SODIUM CHLORIDE (PF) 0.9 % IJ SOLN
INTRAMUSCULAR | Status: AC
  Administered 2019-04-03: 15:00:00
  Filled 2019-04-03: qty 50

## 2019-04-03 MED ORDER — AMOXICILLIN-POT CLAVULANATE 875-125 MG PO TABS: 1.0000 | ORAL_TABLET | Freq: Two times a day (BID) | ORAL | 0 refills | Status: AC

## 2019-04-03 MED ORDER — OXYCODONE HCL 5 MG PO TABS: 5.0000 mg | ORAL_TABLET | Freq: Four times a day (QID) | ORAL | 0 refills | Status: DC | PRN

## 2019-04-03 NOTE — ED Provider Notes (Signed)
[facility]-EMERGENCY DEPT Provider Note   CSN: [id number] Arrival date & time: 04/03/19  4098     History   Chief Complaint Chief Complaint  Patient presents with   Abdominal Pain    HPI Matthew Lozano is a 68 y.o. male.     The history is provided by the patient.  Abdominal Pain Pain location:  LLQ Pain quality: aching   Pain radiates to:  Does not radiate Pain severity:  Mild Onset quality:  Gradual Duration:  5 days Timing:  Intermittent Progression:  Waxing and waning Chronicity:  New Context: not alcohol use, not previous surgeries and not recent illness   Relieved by:  Nothing Worsened by:  Movement Ineffective treatments:  NSAIDs Associated symptoms: no anorexia, no belching, no chest pain, no chills, no constipation, no cough, no diarrhea, no dysuria, no fever, no flatus, no hematemesis, no hematuria, no melena, no nausea, no shortness of breath, no sore throat and no vomiting   Risk factors: has not had multiple surgeries     Past Medical History:  Diagnosis Date   GERD (gastroesophageal reflux disease)    Hemorrhoids    MVP (mitral valve prolapse)    Type II diabetes mellitus (HCC) dx'd 2008   Viral meningitis 2001    Patient Active Problem List   Diagnosis Date Noted   Intractable nausea and vomiting 08/03/2018   Acute gastroenteritis 08/02/2018   AKI (acute kidney injury) (HCC) 08/02/2018   Hyponatremia 08/02/2018   Leukocytosis 08/02/2018   Hyperbilirubinemia    Dyslipidemia 01/21/2017   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   Male erectile dysfunction 01/10/2017   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   Chest pain with moderate risk of acute coronary syndrome 01/02/2017   Hemorrhage of anus and rectum 08/29/2014   Soft tissue disorder 08/29/2014   Uncontrolled diabetes mellitus with hyperglycemia (HCC) 10/26/2013   MVP (mitral valve prolapse)     Past Surgical History:  Procedure Laterality Date    LEFT HEART CATH AND CORONARY ANGIOGRAPHY N/A 01/11/2017   Procedure: LEFT HEART CATH AND CORONARY ANGIOGRAPHY;  Surgeon: Swaziland, Peter M, MD;  Location: [facility] INVASIVE CV LAB;  Service: Cardiovascular;  Laterality: N/A;   LIPOMA EXCISION  2001   OFF BACK         Home Medications    Prior to Admission medications   Medication Sig Start Date End Date Taking? Authorizing Provider  bismuth subsalicylate (PEPTO BISMOL) 262 MG/15ML suspension Take 30 mLs by mouth every 6 (six) hours as needed for indigestion or diarrhea or loose stools.   Yes [provider]  ibuprofen (ADVIL) 200 MG tablet Take 1,000 mg by mouth every 6 (six) hours as needed for fever, headache or moderate pain.    Yes [provider]  JARDIANCE 25 MG TABS tablet Take 25 mg by mouth daily. 04/26/18  Yes [provider]  alum & mag hydroxide-simeth (MAALOX/MYLANTA) 200-200-20 MG/5ML suspension Take 30 mLs by mouth every 4 (four) hours as needed for indigestion. Patient not taking: Reported on 04/01/2019 08/05/18   Kendell Bane, MD  amoxicillin-clavulanate (AUGMENTIN) 875-125 MG tablet Take 1 tablet by mouth every 12 (twelve) hours for 10 days. 04/03/19 04/13/19  Soraya Paquette, DO  famotidine (PEPCID) 20 MG tablet Take 1 tablet (20 mg total) by mouth 2 (two) times daily for 30 days. Patient not taking: Reported on 04/03/2019 08/05/18 09/04/18  Kendell Bane, MD  ondansetron (ZOFRAN) 4 MG tablet Take 1 tablet (4 mg total) by mouth  every 6 (six) hours as needed for nausea. Patient not taking: Reported on 04/01/2019 08/05/18   Kendell BaneShahmehdi, Seyed A, MD  oxyCODONE (ROXICODONE) 5 MG immediate release tablet Take 1 tablet (5 mg total) by mouth every 6 (six) hours as needed for up to 10 doses for severe pain. 04/03/19   Virgina Norfolkuratolo, Halden Phegley, DO    Family History Family History  Problem Relation Age of Onset   CVA Father    Other Brother        AIDS    Social History Social History   Tobacco Use    Smoking status: Former Smoker    Packs/day: 0.50    Years: 10.00    Pack years: 5.00    Types: Cigarettes    Quit date: 1987    Years since quitting: 33.8   Smokeless tobacco: Never Used  Substance Use Topics   Alcohol use: Yes    Alcohol/week: 2.0 - 3.0 standard drinks    Types: 2 - 3 Cans of beer per week    Comment: 12/31/2016 "stopped 11/2016"   Drug use: Yes    Types: Marijuana    Comment: 12/31/2016 "weekly"     Allergies   Metformin and related, Androgel [testosterone], Codeine, and Phenergan [promethazine hcl]   Review of Systems Review of Systems  Constitutional: Negative for chills and fever.  HENT: Negative for ear pain and sore throat.   Eyes: Negative for pain and visual disturbance.  Respiratory: Negative for cough and shortness of breath.   Cardiovascular: Negative for chest pain and palpitations.  Gastrointestinal: Positive for abdominal pain. Negative for abdominal distention, anal bleeding, anorexia, blood in stool, constipation, diarrhea, flatus, hematemesis, melena, nausea, rectal pain and vomiting.  Genitourinary: Negative for dysuria and hematuria.  Musculoskeletal: Negative for arthralgias and back pain.  Skin: Negative for color change and rash.  Neurological: Negative for seizures and syncope.  All other systems reviewed and are negative.    Physical Exam Updated Vital Signs  ED Triage Vitals  Enc Vitals Group     BP 04/03/19 1009 132/79     Pulse Rate 04/03/19 1009 67     Resp 04/03/19 1009 16     Temp 04/03/19 1009 97.8 F (36.6 C)     Temp Source 04/03/19 1009 Oral     SpO2 04/03/19 1009 100 %     Weight --      Height --      Head Circumference --      Peak Flow --      Pain Score 04/03/19 1018 8     Pain Loc --      Pain Edu? --      Excl. in GC? --     Physical Exam Vitals signs and nursing note reviewed.  Constitutional:      Appearance: He is well-developed.  HENT:     Head: Normocephalic and atraumatic.  Eyes:      Extraocular Movements: Extraocular movements intact.     Conjunctiva/sclera: Conjunctivae normal.     Pupils: Pupils are equal, round, and reactive to light.  Neck:     Musculoskeletal: Neck supple.  Cardiovascular:     Rate and Rhythm: Normal rate and regular rhythm.     Heart sounds: Normal heart sounds. No murmur.  Pulmonary:     Effort: Pulmonary effort is normal. No respiratory distress.     Breath sounds: Normal breath sounds.  Abdominal:     General: Abdomen is flat. Bowel sounds are normal. There  is no distension.     Palpations: Abdomen is soft.     Tenderness: There is abdominal tenderness in the left lower quadrant. There is no right CVA tenderness, left CVA tenderness, guarding or rebound. Negative signs include Murphy's sign and Rovsing's sign.  Skin:    General: Skin is warm and dry.     Capillary Refill: Capillary refill takes less than 2 seconds.  Neurological:     General: No focal deficit present.     Mental Status: He is alert.      ED Treatments / Results  Labs (all labs ordered are listed, but only abnormal results are displayed) Labs Reviewed  CBC WITH DIFFERENTIAL/PLATELET - Abnormal; Notable for the following components:      Result Value   MCHC 36.2 (*)    All other components within normal limits  COMPREHENSIVE METABOLIC PANEL - Abnormal; Notable for the following components:   Potassium 3.4 (*)    CO2 19 (*)    Glucose, Bld 188 (*)    All other components within normal limits  LIPASE, BLOOD    EKG None  Radiology Ct Abdomen Pelvis W Contrast  Result Date: 04/03/2019 CLINICAL DATA:  68 year old male with abdominal distention and left lower quadrant pain. EXAM: CT ABDOMEN AND PELVIS WITH CONTRAST TECHNIQUE: Multidetector CT imaging of the abdomen and pelvis was performed using the standard protocol following bolus administration of intravenous contrast. CONTRAST:  OMNIPAQUE IOHEXOL 300 MG/ML  SOLN COMPARISON:  CT of the abdomen pelvis  dated 04/01/2019 FINDINGS: Lower chest: The visualized lung bases are clear. There is coronary vascular calcification and calcification of the mitral annulus. No intra-abdominal free air or free fluid. Hepatobiliary: The liver is unremarkable. No intrahepatic biliary ductal dilatation. The gallbladder is unremarkable. Pancreas: Unremarkable. No pancreatic ductal dilatation or surrounding inflammatory changes. Spleen: Normal in size without focal abnormality. Adrenals/Urinary Tract: The adrenal glands are unremarkable. There is no hydronephrosis on either side. There is symmetric enhancement and excretion of contrast by both kidneys. The visualized ureters appear unremarkable. The urinary bladder is only partially distended. There is apparent diffuse thickening of the bladder wall which may be partly related to underdistention. Cystitis is not excluded. Correlation with urinalysis recommended. Stomach/Bowel: There is sigmoid diverticulosis without active inflammatory changes. Mild thickened appearance of the rectosigmoid most likely related to underdistention. There is mild hazy appearance of the small bowel mesentery. Clinical correlation is recommended to exclude mild enteritis. There is no bowel obstruction. The appendix is normal. Vascular/Lymphatic: Mild aortoiliac atherosclerotic disease. The IVC is unremarkable. No portal venous gas. There is no adenopathy. Reproductive: The prostate and seminal vesicles are grossly unremarkable. No pelvic mass. Other: None Musculoskeletal: Degenerative changes of the spine. No acute osseous pathology. IMPRESSION: 1. Mild hazy appearance of the small bowel mesentery. Clinical correlation is recommended to exclude mild enteritis. No bowel obstruction. Normal appendix. 2. Sigmoid diverticulosis. 3. Aortic atherosclerosis. Aortic Atherosclerosis (ICD10-I70.0). Electronically Signed   By: Elgie Collard M.D.   On: 04/03/2019 14:40    Procedures Procedures (including critical  care time)  Medications Ordered in ED Medications  sodium chloride (PF) 0.9 % injection (has no administration in time range)  iohexol (OMNIPAQUE) 300 MG/ML solution 100 mL (100 mLs Intravenous Contrast Given 04/03/19 1400)     Initial Impression / Assessment and Plan / ED Course  I have reviewed the triage vital signs and the nursing notes.  Pertinent labs & imaging results that were available during my care of the patient were  reviewed by me and considered in my medical decision making (see chart for details).     Matthew Lozano is a 68 year old male with history of reflux, diabetes who presents the ED with abdominal pain.  Patient with normal vitals.  No fever.  Actually saw this patient several days ago for similar complaint.  He had overall unremarkable work-up.  Had a CT scan and labs that showed no acute findings.  My suspicion was for possible muscular pain as he was having some pain in his low back and left thigh.  Today he states that pain is now mostly focally in the left lower part of his abdomen.  Does not have any testicular pain.  He is concerned that he might need a colonoscopy.  He states that he had a normal bowel movement this morning which seemed to help the pain.  He denies any urinary symptoms.  He has no history of kidney stones.  Shared decision was made to recheck lab work and get a new CT scan to see if he has developed any type of infectious process such as diverticulitis or other intra-abdominal process.  Less likely bowel obstruction given no nausea, no vomiting, normal bowel movement this morning.  Overall he is not having any urinary symptoms.  Doubt kidney stone.  Seems that he is clinically improving however he thought the pain would be away at this time.  He may need a colonoscopy but unlikely a need for an emergent colonoscopy as he is not having any bleeding.  No hemorrhoids.  Will get lab work and CT scan.  Anticipate follow-up with GI outpatient  Lab work with  no significant anemia, electrolyte abnormality, kidney injury.  CT scan shows possible enteritis which would likely fit this clinical picture.  Appendix is normal.  No kidney stones.  Will treat with antibiotics.  Will prescribe narcotic pain medicine.  We will have him follow-up with his primary care doctor.  Given return precautions.  Given information to follow-up with GI as well.  This chart was dictated using voice recognition software.  Despite best efforts to proofread,  errors can occur which can change the documentation meaning.    Final Clinical Impressions(s) / ED Diagnoses   Final diagnoses:  Enteritis    ED Discharge Orders         Ordered    amoxicillin-clavulanate (AUGMENTIN) 875-125 MG tablet  Every 12 hours     04/03/19 1453    oxyCODONE (ROXICODONE) 5 MG immediate release tablet  Every 6 hours PRN     04/03/19 [place], [place], DO 04/03/19 1453

## 2019-04-03 NOTE — ED Triage Notes (Signed)
Per pt, states he was seen on Sunday for the same symptoms-states work up was negative-states he has been taking 1000 mg of Advil as recommended for pain-states he is having lower abdominal pain after BM-states he is unable to eat due to discomfort-patient thinks it is his colon

## 2019-04-03 NOTE — ED Notes (Signed)
Pt ambulatory from triage 

## 2019-04-06 DIAGNOSIS — Z7984 Long term (current) use of oral hypoglycemic drugs: Secondary | ICD-10-CM | POA: Diagnosis not present

## 2019-04-06 DIAGNOSIS — R1032 Left lower quadrant pain: Secondary | ICD-10-CM | POA: Diagnosis not present

## 2019-04-06 DIAGNOSIS — K5900 Constipation, unspecified: Secondary | ICD-10-CM | POA: Diagnosis not present

## 2019-04-06 DIAGNOSIS — M6281 Muscle weakness (generalized): Secondary | ICD-10-CM | POA: Diagnosis not present

## 2019-04-06 DIAGNOSIS — Z1211 Encounter for screening for malignant neoplasm of colon: Secondary | ICD-10-CM | POA: Diagnosis not present

## 2019-04-06 DIAGNOSIS — E1165 Type 2 diabetes mellitus with hyperglycemia: Secondary | ICD-10-CM | POA: Diagnosis not present

## 2019-04-06 DIAGNOSIS — M5136 Other intervertebral disc degeneration, lumbar region: Secondary | ICD-10-CM | POA: Diagnosis not present

## 2019-04-12 DIAGNOSIS — M5416 Radiculopathy, lumbar region: Secondary | ICD-10-CM | POA: Diagnosis not present

## 2019-04-12 DIAGNOSIS — M545 Low back pain: Secondary | ICD-10-CM | POA: Diagnosis not present

## 2019-04-13 ENCOUNTER — Other Ambulatory Visit: Payer: Self-pay

## 2019-04-13 DIAGNOSIS — Z20822 Contact with and (suspected) exposure to covid-19: Secondary | ICD-10-CM

## 2019-04-13 DIAGNOSIS — Z20828 Contact with and (suspected) exposure to other viral communicable diseases: Secondary | ICD-10-CM | POA: Diagnosis not present

## 2019-04-15 LAB — NOVEL CORONAVIRUS, NAA: SARS-CoV-2, NAA: NOT DETECTED

## 2019-04-16 DIAGNOSIS — M545 Low back pain: Secondary | ICD-10-CM | POA: Diagnosis not present

## 2019-04-16 DIAGNOSIS — M5136 Other intervertebral disc degeneration, lumbar region: Secondary | ICD-10-CM | POA: Diagnosis not present

## 2019-04-25 DIAGNOSIS — M5416 Radiculopathy, lumbar region: Secondary | ICD-10-CM | POA: Diagnosis not present

## 2019-05-01 DIAGNOSIS — R5383 Other fatigue: Secondary | ICD-10-CM | POA: Diagnosis not present

## 2019-05-01 DIAGNOSIS — R6883 Chills (without fever): Secondary | ICD-10-CM | POA: Diagnosis not present

## 2019-05-01 DIAGNOSIS — Z20828 Contact with and (suspected) exposure to other viral communicable diseases: Secondary | ICD-10-CM | POA: Diagnosis not present

## 2019-07-17 DIAGNOSIS — M545 Low back pain: Secondary | ICD-10-CM | POA: Diagnosis not present

## 2019-07-17 DIAGNOSIS — E119 Type 2 diabetes mellitus without complications: Secondary | ICD-10-CM | POA: Diagnosis not present

## 2019-07-25 DIAGNOSIS — Z1159 Encounter for screening for other viral diseases: Secondary | ICD-10-CM | POA: Diagnosis not present

## 2019-07-30 DIAGNOSIS — Z86010 Personal history of colonic polyps: Secondary | ICD-10-CM | POA: Diagnosis not present

## 2019-07-30 DIAGNOSIS — K5730 Diverticulosis of large intestine without perforation or abscess without bleeding: Secondary | ICD-10-CM | POA: Diagnosis not present

## 2019-07-30 DIAGNOSIS — Z8371 Family history of colonic polyps: Secondary | ICD-10-CM | POA: Diagnosis not present

## 2019-07-30 DIAGNOSIS — K635 Polyp of colon: Secondary | ICD-10-CM | POA: Diagnosis not present

## 2019-08-01 DIAGNOSIS — K635 Polyp of colon: Secondary | ICD-10-CM | POA: Diagnosis not present

## 2019-08-09 DIAGNOSIS — M5416 Radiculopathy, lumbar region: Secondary | ICD-10-CM | POA: Diagnosis not present

## 2019-08-09 DIAGNOSIS — R030 Elevated blood-pressure reading, without diagnosis of hypertension: Secondary | ICD-10-CM | POA: Diagnosis not present

## 2019-08-22 DIAGNOSIS — M5416 Radiculopathy, lumbar region: Secondary | ICD-10-CM | POA: Diagnosis not present

## 2019-08-22 DIAGNOSIS — M5126 Other intervertebral disc displacement, lumbar region: Secondary | ICD-10-CM | POA: Diagnosis not present

## 2019-08-23 DIAGNOSIS — R262 Difficulty in walking, not elsewhere classified: Secondary | ICD-10-CM | POA: Diagnosis not present

## 2019-08-23 DIAGNOSIS — G5722 Lesion of femoral nerve, left lower limb: Secondary | ICD-10-CM | POA: Diagnosis not present

## 2019-08-23 DIAGNOSIS — M79662 Pain in left lower leg: Secondary | ICD-10-CM | POA: Diagnosis not present

## 2019-08-23 DIAGNOSIS — M5416 Radiculopathy, lumbar region: Secondary | ICD-10-CM | POA: Diagnosis not present

## 2019-08-28 DIAGNOSIS — G5722 Lesion of femoral nerve, left lower limb: Secondary | ICD-10-CM | POA: Diagnosis not present

## 2019-08-28 DIAGNOSIS — M79662 Pain in left lower leg: Secondary | ICD-10-CM | POA: Diagnosis not present

## 2019-08-28 DIAGNOSIS — R262 Difficulty in walking, not elsewhere classified: Secondary | ICD-10-CM | POA: Diagnosis not present

## 2019-08-28 DIAGNOSIS — M5416 Radiculopathy, lumbar region: Secondary | ICD-10-CM | POA: Diagnosis not present

## 2019-08-31 DIAGNOSIS — I2510 Atherosclerotic heart disease of native coronary artery without angina pectoris: Secondary | ICD-10-CM | POA: Diagnosis not present

## 2019-08-31 DIAGNOSIS — E1165 Type 2 diabetes mellitus with hyperglycemia: Secondary | ICD-10-CM | POA: Diagnosis not present

## 2019-08-31 DIAGNOSIS — E7800 Pure hypercholesterolemia, unspecified: Secondary | ICD-10-CM | POA: Diagnosis not present

## 2019-08-31 DIAGNOSIS — E118 Type 2 diabetes mellitus with unspecified complications: Secondary | ICD-10-CM | POA: Diagnosis not present

## 2019-08-31 DIAGNOSIS — E119 Type 2 diabetes mellitus without complications: Secondary | ICD-10-CM | POA: Diagnosis not present

## 2019-09-03 DIAGNOSIS — M5416 Radiculopathy, lumbar region: Secondary | ICD-10-CM | POA: Diagnosis not present

## 2019-09-04 DIAGNOSIS — M79662 Pain in left lower leg: Secondary | ICD-10-CM | POA: Diagnosis not present

## 2019-09-04 DIAGNOSIS — R262 Difficulty in walking, not elsewhere classified: Secondary | ICD-10-CM | POA: Diagnosis not present

## 2019-09-04 DIAGNOSIS — G5722 Lesion of femoral nerve, left lower limb: Secondary | ICD-10-CM | POA: Diagnosis not present

## 2019-09-04 DIAGNOSIS — M5416 Radiculopathy, lumbar region: Secondary | ICD-10-CM | POA: Diagnosis not present

## 2019-09-07 DIAGNOSIS — M79662 Pain in left lower leg: Secondary | ICD-10-CM | POA: Diagnosis not present

## 2019-09-07 DIAGNOSIS — M5416 Radiculopathy, lumbar region: Secondary | ICD-10-CM | POA: Diagnosis not present

## 2019-09-07 DIAGNOSIS — R262 Difficulty in walking, not elsewhere classified: Secondary | ICD-10-CM | POA: Diagnosis not present

## 2019-09-07 DIAGNOSIS — G5722 Lesion of femoral nerve, left lower limb: Secondary | ICD-10-CM | POA: Diagnosis not present

## 2019-09-11 DIAGNOSIS — G5722 Lesion of femoral nerve, left lower limb: Secondary | ICD-10-CM | POA: Diagnosis not present

## 2019-09-11 DIAGNOSIS — R262 Difficulty in walking, not elsewhere classified: Secondary | ICD-10-CM | POA: Diagnosis not present

## 2019-09-11 DIAGNOSIS — M79662 Pain in left lower leg: Secondary | ICD-10-CM | POA: Diagnosis not present

## 2019-09-11 DIAGNOSIS — M5416 Radiculopathy, lumbar region: Secondary | ICD-10-CM | POA: Diagnosis not present

## 2019-09-14 DIAGNOSIS — M79662 Pain in left lower leg: Secondary | ICD-10-CM | POA: Diagnosis not present

## 2019-09-14 DIAGNOSIS — M5416 Radiculopathy, lumbar region: Secondary | ICD-10-CM | POA: Diagnosis not present

## 2019-09-14 DIAGNOSIS — G5722 Lesion of femoral nerve, left lower limb: Secondary | ICD-10-CM | POA: Diagnosis not present

## 2019-09-14 DIAGNOSIS — R262 Difficulty in walking, not elsewhere classified: Secondary | ICD-10-CM | POA: Diagnosis not present

## 2019-09-18 DIAGNOSIS — G5722 Lesion of femoral nerve, left lower limb: Secondary | ICD-10-CM | POA: Diagnosis not present

## 2019-09-18 DIAGNOSIS — M5416 Radiculopathy, lumbar region: Secondary | ICD-10-CM | POA: Diagnosis not present

## 2019-09-18 DIAGNOSIS — R262 Difficulty in walking, not elsewhere classified: Secondary | ICD-10-CM | POA: Diagnosis not present

## 2019-09-18 DIAGNOSIS — M79662 Pain in left lower leg: Secondary | ICD-10-CM | POA: Diagnosis not present

## 2019-09-20 DIAGNOSIS — H5201 Hypermetropia, right eye: Secondary | ICD-10-CM | POA: Diagnosis not present

## 2019-09-20 DIAGNOSIS — E119 Type 2 diabetes mellitus without complications: Secondary | ICD-10-CM | POA: Diagnosis not present

## 2019-09-25 DIAGNOSIS — M5416 Radiculopathy, lumbar region: Secondary | ICD-10-CM | POA: Diagnosis not present

## 2019-09-25 DIAGNOSIS — M79662 Pain in left lower leg: Secondary | ICD-10-CM | POA: Diagnosis not present

## 2019-09-25 DIAGNOSIS — G5722 Lesion of femoral nerve, left lower limb: Secondary | ICD-10-CM | POA: Diagnosis not present

## 2019-09-25 DIAGNOSIS — R262 Difficulty in walking, not elsewhere classified: Secondary | ICD-10-CM | POA: Diagnosis not present

## 2019-09-28 DIAGNOSIS — G5722 Lesion of femoral nerve, left lower limb: Secondary | ICD-10-CM | POA: Diagnosis not present

## 2019-09-28 DIAGNOSIS — M79662 Pain in left lower leg: Secondary | ICD-10-CM | POA: Diagnosis not present

## 2019-09-28 DIAGNOSIS — M5416 Radiculopathy, lumbar region: Secondary | ICD-10-CM | POA: Diagnosis not present

## 2019-09-28 DIAGNOSIS — R262 Difficulty in walking, not elsewhere classified: Secondary | ICD-10-CM | POA: Diagnosis not present

## 2019-10-05 DIAGNOSIS — M79662 Pain in left lower leg: Secondary | ICD-10-CM | POA: Diagnosis not present

## 2019-10-05 DIAGNOSIS — M5416 Radiculopathy, lumbar region: Secondary | ICD-10-CM | POA: Diagnosis not present

## 2019-10-05 DIAGNOSIS — G5722 Lesion of femoral nerve, left lower limb: Secondary | ICD-10-CM | POA: Diagnosis not present

## 2019-10-05 DIAGNOSIS — R262 Difficulty in walking, not elsewhere classified: Secondary | ICD-10-CM | POA: Diagnosis not present

## 2019-10-09 DIAGNOSIS — G5722 Lesion of femoral nerve, left lower limb: Secondary | ICD-10-CM | POA: Diagnosis not present

## 2019-10-09 DIAGNOSIS — R262 Difficulty in walking, not elsewhere classified: Secondary | ICD-10-CM | POA: Diagnosis not present

## 2019-10-09 DIAGNOSIS — M5416 Radiculopathy, lumbar region: Secondary | ICD-10-CM | POA: Diagnosis not present

## 2019-10-09 DIAGNOSIS — M79662 Pain in left lower leg: Secondary | ICD-10-CM | POA: Diagnosis not present

## 2019-10-10 DIAGNOSIS — M5416 Radiculopathy, lumbar region: Secondary | ICD-10-CM | POA: Diagnosis not present

## 2019-10-11 DIAGNOSIS — M5416 Radiculopathy, lumbar region: Secondary | ICD-10-CM | POA: Diagnosis not present

## 2019-10-11 DIAGNOSIS — M79662 Pain in left lower leg: Secondary | ICD-10-CM | POA: Diagnosis not present

## 2019-10-11 DIAGNOSIS — R262 Difficulty in walking, not elsewhere classified: Secondary | ICD-10-CM | POA: Diagnosis not present

## 2019-10-11 DIAGNOSIS — G5722 Lesion of femoral nerve, left lower limb: Secondary | ICD-10-CM | POA: Diagnosis not present

## 2019-10-16 DIAGNOSIS — R262 Difficulty in walking, not elsewhere classified: Secondary | ICD-10-CM | POA: Diagnosis not present

## 2019-10-16 DIAGNOSIS — M79662 Pain in left lower leg: Secondary | ICD-10-CM | POA: Diagnosis not present

## 2019-10-16 DIAGNOSIS — M5416 Radiculopathy, lumbar region: Secondary | ICD-10-CM | POA: Diagnosis not present

## 2019-10-16 DIAGNOSIS — G5722 Lesion of femoral nerve, left lower limb: Secondary | ICD-10-CM | POA: Diagnosis not present

## 2019-10-18 DIAGNOSIS — R262 Difficulty in walking, not elsewhere classified: Secondary | ICD-10-CM | POA: Diagnosis not present

## 2019-10-18 DIAGNOSIS — G5722 Lesion of femoral nerve, left lower limb: Secondary | ICD-10-CM | POA: Diagnosis not present

## 2019-10-18 DIAGNOSIS — M5416 Radiculopathy, lumbar region: Secondary | ICD-10-CM | POA: Diagnosis not present

## 2019-10-18 DIAGNOSIS — M79662 Pain in left lower leg: Secondary | ICD-10-CM | POA: Diagnosis not present

## 2019-10-25 DIAGNOSIS — R262 Difficulty in walking, not elsewhere classified: Secondary | ICD-10-CM | POA: Diagnosis not present

## 2019-10-25 DIAGNOSIS — M5416 Radiculopathy, lumbar region: Secondary | ICD-10-CM | POA: Diagnosis not present

## 2019-10-25 DIAGNOSIS — G5722 Lesion of femoral nerve, left lower limb: Secondary | ICD-10-CM | POA: Diagnosis not present

## 2019-10-25 DIAGNOSIS — M79662 Pain in left lower leg: Secondary | ICD-10-CM | POA: Diagnosis not present

## 2019-10-31 DIAGNOSIS — M5416 Radiculopathy, lumbar region: Secondary | ICD-10-CM | POA: Diagnosis not present

## 2019-11-01 DIAGNOSIS — M5416 Radiculopathy, lumbar region: Secondary | ICD-10-CM | POA: Diagnosis not present

## 2019-11-01 DIAGNOSIS — R262 Difficulty in walking, not elsewhere classified: Secondary | ICD-10-CM | POA: Diagnosis not present

## 2019-11-01 DIAGNOSIS — G5722 Lesion of femoral nerve, left lower limb: Secondary | ICD-10-CM | POA: Diagnosis not present

## 2019-11-01 DIAGNOSIS — M79662 Pain in left lower leg: Secondary | ICD-10-CM | POA: Diagnosis not present

## 2019-11-06 DIAGNOSIS — R262 Difficulty in walking, not elsewhere classified: Secondary | ICD-10-CM | POA: Diagnosis not present

## 2019-11-06 DIAGNOSIS — M5416 Radiculopathy, lumbar region: Secondary | ICD-10-CM | POA: Diagnosis not present

## 2019-11-06 DIAGNOSIS — M79662 Pain in left lower leg: Secondary | ICD-10-CM | POA: Diagnosis not present

## 2019-11-06 DIAGNOSIS — G5722 Lesion of femoral nerve, left lower limb: Secondary | ICD-10-CM | POA: Diagnosis not present

## 2019-12-03 DIAGNOSIS — I2510 Atherosclerotic heart disease of native coronary artery without angina pectoris: Secondary | ICD-10-CM | POA: Diagnosis not present

## 2019-12-03 DIAGNOSIS — E1165 Type 2 diabetes mellitus with hyperglycemia: Secondary | ICD-10-CM | POA: Diagnosis not present

## 2019-12-03 DIAGNOSIS — E7800 Pure hypercholesterolemia, unspecified: Secondary | ICD-10-CM | POA: Diagnosis not present

## 2019-12-03 DIAGNOSIS — E118 Type 2 diabetes mellitus with unspecified complications: Secondary | ICD-10-CM | POA: Diagnosis not present

## 2019-12-12 DIAGNOSIS — M79662 Pain in left lower leg: Secondary | ICD-10-CM | POA: Diagnosis not present

## 2019-12-12 DIAGNOSIS — R262 Difficulty in walking, not elsewhere classified: Secondary | ICD-10-CM | POA: Diagnosis not present

## 2019-12-12 DIAGNOSIS — G5722 Lesion of femoral nerve, left lower limb: Secondary | ICD-10-CM | POA: Diagnosis not present

## 2019-12-12 DIAGNOSIS — M5416 Radiculopathy, lumbar region: Secondary | ICD-10-CM | POA: Diagnosis not present

## 2019-12-20 DIAGNOSIS — M5416 Radiculopathy, lumbar region: Secondary | ICD-10-CM | POA: Diagnosis not present

## 2019-12-20 DIAGNOSIS — G5722 Lesion of femoral nerve, left lower limb: Secondary | ICD-10-CM | POA: Diagnosis not present

## 2019-12-20 DIAGNOSIS — M79662 Pain in left lower leg: Secondary | ICD-10-CM | POA: Diagnosis not present

## 2019-12-20 DIAGNOSIS — R262 Difficulty in walking, not elsewhere classified: Secondary | ICD-10-CM | POA: Diagnosis not present

## 2019-12-26 DIAGNOSIS — G5722 Lesion of femoral nerve, left lower limb: Secondary | ICD-10-CM | POA: Diagnosis not present

## 2019-12-26 DIAGNOSIS — R262 Difficulty in walking, not elsewhere classified: Secondary | ICD-10-CM | POA: Diagnosis not present

## 2019-12-26 DIAGNOSIS — M5416 Radiculopathy, lumbar region: Secondary | ICD-10-CM | POA: Diagnosis not present

## 2019-12-26 DIAGNOSIS — M79662 Pain in left lower leg: Secondary | ICD-10-CM | POA: Diagnosis not present

## 2020-01-04 DIAGNOSIS — M5416 Radiculopathy, lumbar region: Secondary | ICD-10-CM | POA: Diagnosis not present

## 2020-01-04 DIAGNOSIS — R262 Difficulty in walking, not elsewhere classified: Secondary | ICD-10-CM | POA: Diagnosis not present

## 2020-01-04 DIAGNOSIS — M79662 Pain in left lower leg: Secondary | ICD-10-CM | POA: Diagnosis not present

## 2020-01-04 DIAGNOSIS — G5722 Lesion of femoral nerve, left lower limb: Secondary | ICD-10-CM | POA: Diagnosis not present

## 2020-01-09 DIAGNOSIS — M5416 Radiculopathy, lumbar region: Secondary | ICD-10-CM | POA: Diagnosis not present

## 2020-01-09 DIAGNOSIS — G5722 Lesion of femoral nerve, left lower limb: Secondary | ICD-10-CM | POA: Diagnosis not present

## 2020-01-09 DIAGNOSIS — R262 Difficulty in walking, not elsewhere classified: Secondary | ICD-10-CM | POA: Diagnosis not present

## 2020-01-09 DIAGNOSIS — M79662 Pain in left lower leg: Secondary | ICD-10-CM | POA: Diagnosis not present

## 2020-01-16 DIAGNOSIS — G5722 Lesion of femoral nerve, left lower limb: Secondary | ICD-10-CM | POA: Diagnosis not present

## 2020-01-16 DIAGNOSIS — M79662 Pain in left lower leg: Secondary | ICD-10-CM | POA: Diagnosis not present

## 2020-01-16 DIAGNOSIS — M5416 Radiculopathy, lumbar region: Secondary | ICD-10-CM | POA: Diagnosis not present

## 2020-01-16 DIAGNOSIS — R262 Difficulty in walking, not elsewhere classified: Secondary | ICD-10-CM | POA: Diagnosis not present

## 2020-02-01 DIAGNOSIS — Z20822 Contact with and (suspected) exposure to covid-19: Secondary | ICD-10-CM | POA: Diagnosis not present

## 2020-02-01 DIAGNOSIS — R195 Other fecal abnormalities: Secondary | ICD-10-CM | POA: Diagnosis not present

## 2020-02-01 DIAGNOSIS — R5383 Other fatigue: Secondary | ICD-10-CM | POA: Diagnosis not present

## 2020-02-21 DIAGNOSIS — E119 Type 2 diabetes mellitus without complications: Secondary | ICD-10-CM | POA: Diagnosis not present

## 2020-02-21 DIAGNOSIS — E1165 Type 2 diabetes mellitus with hyperglycemia: Secondary | ICD-10-CM | POA: Diagnosis not present

## 2020-02-21 DIAGNOSIS — E7800 Pure hypercholesterolemia, unspecified: Secondary | ICD-10-CM | POA: Diagnosis not present

## 2020-02-21 DIAGNOSIS — E118 Type 2 diabetes mellitus with unspecified complications: Secondary | ICD-10-CM | POA: Diagnosis not present

## 2020-02-21 DIAGNOSIS — I2510 Atherosclerotic heart disease of native coronary artery without angina pectoris: Secondary | ICD-10-CM | POA: Diagnosis not present

## 2020-03-18 DIAGNOSIS — E119 Type 2 diabetes mellitus without complications: Secondary | ICD-10-CM | POA: Diagnosis not present

## 2020-03-18 DIAGNOSIS — I2510 Atherosclerotic heart disease of native coronary artery without angina pectoris: Secondary | ICD-10-CM | POA: Diagnosis not present

## 2020-03-18 DIAGNOSIS — E7800 Pure hypercholesterolemia, unspecified: Secondary | ICD-10-CM | POA: Diagnosis not present

## 2020-04-14 DIAGNOSIS — Z23 Encounter for immunization: Secondary | ICD-10-CM | POA: Diagnosis not present

## 2020-04-30 DIAGNOSIS — E785 Hyperlipidemia, unspecified: Secondary | ICD-10-CM | POA: Diagnosis not present

## 2020-04-30 DIAGNOSIS — E119 Type 2 diabetes mellitus without complications: Secondary | ICD-10-CM | POA: Diagnosis not present

## 2020-04-30 DIAGNOSIS — E1165 Type 2 diabetes mellitus with hyperglycemia: Secondary | ICD-10-CM | POA: Diagnosis not present

## 2020-04-30 DIAGNOSIS — E118 Type 2 diabetes mellitus with unspecified complications: Secondary | ICD-10-CM | POA: Diagnosis not present

## 2020-04-30 DIAGNOSIS — G8929 Other chronic pain: Secondary | ICD-10-CM | POA: Diagnosis not present

## 2020-04-30 DIAGNOSIS — E7800 Pure hypercholesterolemia, unspecified: Secondary | ICD-10-CM | POA: Diagnosis not present

## 2020-04-30 DIAGNOSIS — I2510 Atherosclerotic heart disease of native coronary artery without angina pectoris: Secondary | ICD-10-CM | POA: Diagnosis not present

## 2020-05-28 DIAGNOSIS — E119 Type 2 diabetes mellitus without complications: Secondary | ICD-10-CM | POA: Diagnosis not present

## 2020-05-28 DIAGNOSIS — R42 Dizziness and giddiness: Secondary | ICD-10-CM | POA: Diagnosis not present

## 2020-05-28 DIAGNOSIS — I2510 Atherosclerotic heart disease of native coronary artery without angina pectoris: Secondary | ICD-10-CM | POA: Diagnosis not present

## 2020-05-28 DIAGNOSIS — Z0000 Encounter for general adult medical examination without abnormal findings: Secondary | ICD-10-CM | POA: Diagnosis not present

## 2020-05-28 DIAGNOSIS — R079 Chest pain, unspecified: Secondary | ICD-10-CM | POA: Diagnosis not present

## 2020-05-29 ENCOUNTER — Encounter: Payer: Self-pay | Admitting: Cardiology

## 2020-05-29 ENCOUNTER — Other Ambulatory Visit: Payer: Self-pay | Admitting: Family Medicine

## 2020-05-29 ENCOUNTER — Other Ambulatory Visit: Payer: Self-pay

## 2020-05-29 ENCOUNTER — Ambulatory Visit (INDEPENDENT_AMBULATORY_CARE_PROVIDER_SITE_OTHER): Payer: Medicare Other | Admitting: Cardiology

## 2020-05-29 VITALS — BP 102/58 | HR 66 | Ht 71.0 in | Wt 164.8 lb

## 2020-05-29 DIAGNOSIS — I2510 Atherosclerotic heart disease of native coronary artery without angina pectoris: Secondary | ICD-10-CM | POA: Diagnosis not present

## 2020-05-29 DIAGNOSIS — R42 Dizziness and giddiness: Secondary | ICD-10-CM

## 2020-05-29 DIAGNOSIS — E785 Hyperlipidemia, unspecified: Secondary | ICD-10-CM

## 2020-05-29 DIAGNOSIS — F1290 Cannabis use, unspecified, uncomplicated: Secondary | ICD-10-CM | POA: Diagnosis not present

## 2020-05-29 DIAGNOSIS — R079 Chest pain, unspecified: Secondary | ICD-10-CM | POA: Diagnosis not present

## 2020-05-29 DIAGNOSIS — R519 Headache, unspecified: Secondary | ICD-10-CM

## 2020-05-29 MED ORDER — ASPIRIN EC 81 MG PO TBEC: 81.0000 mg | DELAYED_RELEASE_TABLET | Freq: Every day | ORAL | 3 refills | Status: DC

## 2020-05-29 MED ORDER — ROSUVASTATIN CALCIUM 5 MG PO TABS: 5.0000 mg | ORAL_TABLET | Freq: Every day | ORAL | 3 refills | Status: DC

## 2020-05-29 NOTE — Progress Notes (Signed)
Cardiology Office Note:    Date:  05/29/2020   ID:  Matthew Lozano, DOB [birth year], MRN [id number]  PCP:  Catha Gosselin, MD  Cardiologist:  No primary care provider on file.  Electrophysiologist:  None   Referring MD: Catha Gosselin, MD   Chief Complaint  Patient presents with  . Chest Pain    History of Present Illness:    Matthew Lozano is a 70 y.o. male with a hx of T2DM, hypertension, hyperlipidemia, nonobstructive CAD who is referred by Dr. Clarene Duke for evaluation of chest pain.  Underwent coronary CTA on 01/01/2017, showed calcium score 618 (84th percentile), mild disease in LAD, diffuse at least moderate disease in mid RCA.  CT FFR did not show any significant stenosis, 0.89 in LAD, 0.88 in RCA.  Echocardiogram 01/02/2017 showed EF 55 to 60%, grade 1 diastolic dysfunction, no significant valvular disease.  Cardiac catheterization on 01/11/2017 showed 20% mid LAD, 30% ostial ramus, 10% OM1, 20% proximal to mid RCA stenosis.  Recent labs on 05/28/2020 showed A1c 7.5%, creatinine 0.87, hemoglobin 16.8, TSH 1.5.  LDL 76 on 11/21/2019.  He reports his chest pain started months ago.  Reports can occur throughout his whole chest.  Occurs daily.  Describes dull aching pain, 1 out of 10 in intensity.  Has not noted what brings it on.  Does not seem to be related to exertion.  States that he lifts weights daily and does not cause chest pain.  No shortness of breath.  Does report dizzy spells, but has vertigo.  Denies any syncope.  Denies any lower extremity edema.  Reports occasional palpitations that last for few seconds about once per month.  Daily marijuana use.  Former smoker.  Father had CVA in 39s   Past Medical History:  Diagnosis Date  . GERD (gastroesophageal reflux disease)   . Hemorrhoids   . MVP (mitral valve prolapse)   . Type II diabetes mellitus (HCC) dx'd 2008  . Viral meningitis 2001    Past Surgical History:  Procedure Laterality Date  . LEFT HEART CATH AND CORONARY  ANGIOGRAPHY N/A 01/11/2017   Procedure: LEFT HEART CATH AND CORONARY ANGIOGRAPHY;  Surgeon: Swazil[place], Peter M, MD;  Location: [facility] INVASIVE CV LAB;  Service: Cardiovascular;  Laterality: N/A;  . LIPOMA EXCISION  2001   OFF BACK     Current Medications: Current Meds  Medication Sig  . aspirin EC 81 MG tablet Take 1 tablet (81 mg total) by mouth daily. Swallow whole.  . ibuprofen (ADVIL) 200 MG tablet Take 1,000 mg by mouth every 6 (six) hours as needed for fever, headache or moderate pain.   Marland Kitchen JARDIANCE 25 MG TABS tablet Take 25 mg by mouth daily.  . rosuvastatin (CRESTOR) 5 MG tablet Take 1 tablet (5 mg total) by mouth daily.     Allergies:   Atorvastatin, Glimepiride, Metformin and related, Other, Tramadol hcl, Androgel [testosterone], Codeine, and Phenergan [promethazine hcl]   Social History   Socioeconomic History  . Marital status: Divorced    Spouse name: Not on file  . Number of children: Not on file  . Years of education: Not on file  . Highest education level: Not on file  Occupational History  . Not on file  Tobacco Use  . Smoking status: Former Smoker    Packs/day: 0.50    Years: 10.00    Pack years: 5.00    Types: Cigarettes    Quit date: 1987    Years since quitting: 35.0  .  Smokeless tobacco: Never Used  Vaping Use  . Vaping Use: Never used  Substance and Sexual Activity  . Alcohol use: Yes    Alcohol/week: 2.0 - 3.0 standard drinks    Types: 2 - 3 Cans of beer per week    Comment: 12/31/2016 "stopped 11/2016"  . Drug use: Yes    Types: Marijuana    Comment: 12/31/2016 "weekly"  . Sexual activity: Not Currently  Other Topics Concern  . Not on file  Social History Narrative  . Not on file   Social Determinants of Health   Financial Resource Strain: Not on file  Food Insecurity: Not on file  Transportation Needs: Not on file  Physical Activity: Not on file  Stress: Not on file  Social Connections: Not on file     Family History: The patient's family  history includes CVA in his father; Other in his brother.  ROS:   Please see the history of present illness.     All other systems reviewed and are negative.  EKGs/Labs/Other Studies Reviewed:    The following studies were reviewed today:   EKG:  EKG is ordered today.  The ekg ordered today demonstrates normal sinus rhythm, rate 66, no ST/T abnormalities  Recent Labs: No results found for requested labs within last 8760 hours.  Recent Lipid Panel    Component Value Date/Time   CHOL 146 01/01/2017 0408   TRIG 69 01/01/2017 0408   HDL 50 01/01/2017 0408   CHOLHDL 2.9 01/01/2017 0408   VLDL 14 01/01/2017 0408   LDLCALC 82 01/01/2017 0408    Physical Exam:    VS:  BP (!) 102/58   Pulse 66   Ht 5\' 11"  (1.803 m)   Wt 164 lb 12.8 oz (74.8 kg)   SpO2 96%   BMI 22.98 kg/m     Wt Readings from Last 3 Encounters:  05/29/20 164 lb 12.8 oz (74.8 kg)  04/01/19 170 lb (77.1 kg)  08/04/18 179 lb 10.8 oz (81.5 kg)     GEN:  Well nourished, well developed in no acute distress HEENT: Normal NECK: No JVD; No carotid bruits LYMPHATICS: No lymphadenopathy CARDIAC: RRR, no murmurs, rubs, gallops RESPIRATORY:  Clear to auscultation without rales, wheezing or rhonchi  ABDOMEN: Soft, non-tender, non-distended MUSCULOSKELETAL:  No edema; No deformity  SKIN: Warm and dry NEUROLOGIC:  Alert and oriented x 3 PSYCHIATRIC:  Normal affect   ASSESSMENT:    1. Chest pain of uncertain etiology   2. CAD in native artery   3. Hyperlipidemia, unspecified hyperlipidemia type   4. Marijuana use    PLAN:    CAD: Underwent coronary CTA on 01/01/2017, showed calcium score 618 (84th percentile), mild disease in LAD, diffuse at least moderate disease in mid RCA.  CT FFR did not show any significant stenosis, 0.89 in LAD, 0.88 in RCA.  Echocardiogram 01/02/2017 showed EF 55 to 60%, grade 1 diastolic dysfunction, no significant valvular disease.  Cardiac catheterization on 01/11/2017 showed 20% mid LAD,  30% ostial ramus, 10% OM1, 20% proximal to mid RCA stenosis.  Currently reporting atypical chest pain. -Reports did not tolerate statin due to significant fatigue previously though is willing to try again.  We will start low-dose rosuvastatin 5 mg daily.  LDL 76 in June 2021, goal less than 70 -Start aspirin 81 mg daily -Treadmill Myoview to evaluate for ischemia -Echocardiogram  Chest pain: Atypical in description but does have known nonobstructive CAD as above.  Will check treadmill Myoview as above.  T2DM: On januvia and Jardiance.  A1c 7.5% on 05/28/2020.  Hyperlipidemia: LDL 76 on 11/21/2019.  Reported previous intolerance to statins due to feeling significant fatigue.  We will try low-dose rosuvastatin 5 mg daily.  If unable to tolerate, will refer to lipid clinic for evaluation  Marijuana use: Patient counseled on risks of marijuana use and cessation recommended.   RTC in 3 months  Shared Decision Making/Informed Consent The risks [chest pain, shortness of breath, cardiac arrhythmias, dizziness, blood pressure fluctuations, myocardial infarction, stroke/transient ischemic attack, nausea, vomiting, allergic reaction, radiation exposure, metallic taste sensation and life-threatening complications (estimated to be 1 in 10,000)], benefits (risk stratification, diagnosing coronary artery disease, treatment guidance) and alternatives of a nuclear stress test were discussed in detail with Mr. Pelzer and he agrees to proceed.      Medication Adjustments/Labs and Tests Ordered: Current medicines are reviewed at length with the patient today.  Concerns regarding medicines are outlined above.  Orders Placed This Encounter  Procedures  . MYOCARDIAL PERFUSION IMAGING  . EKG 12-Lead  . ECHOCARDIOGRAM COMPLETE   Meds ordered this encounter  Medications  . aspirin EC 81 MG tablet    Sig: Take 1 tablet (81 mg total) by mouth daily. Swallow whole.    Dispense:  90 tablet    Refill:  3  .  rosuvastatin (CRESTOR) 5 MG tablet    Sig: Take 1 tablet (5 mg total) by mouth daily.    Dispense:  90 tablet    Refill:  3    Patient Instructions  Medication Instructions:  START rosuvastatin (Crestor) 5 mg daily START aspirin 81 mg daily  *If you need a refill on your cardiac medications before your next appointment, please call your pharmacy*  Testing/Procedures: Your physician has requested that you have an exercise stress myoview. For further information please visit [email or web address]. Please follow instruction sheet, as given.  Your physician has requested that you have an echocardiogram. Echocardiography is a painless test that uses sound waves to create images of your heart. It provides your doctor with information about the size and shape of your heart and how well your heart's chambers and valves are working. This procedure takes approximately one hour. There are no restrictions for this procedure. This will be done at our [facility] location:  [place]: At [facility], you and your health needs are our priority.  As part of our continuing mission to provide you with exceptional heart care, we have created designated Provider Care Teams.  These Care Teams include your primary Cardiologist (physician) and Advanced Practice Providers (APPs -  Physician Assistants and Nurse Practitioners) who all work together to provide you with the care you need, when you need it.  We recommend signing up for the patient portal called "MyChart".  Sign up information is provided on this After Visit Summary.  MyChart is used to connect with patients for Virtual Visits (Telemedicine).  Patients are able to view lab/test results, encounter notes, upcoming appointments, etc.  Non-urgent messages can be sent to your provider as well.   To learn more about what you can do with MyChart, go to [email or web address].    Your next appointment:   3 month(s)  The  format for your next appointment:   In Person  Provider:   Oswaldo Milian, MD        Signed, Donato Heinz, MD  05/29/2020 11:01 AM    [place]

## 2020-05-29 NOTE — Patient Instructions (Signed)
Medication Instructions:  START rosuvastatin (Crestor) 5 mg daily START aspirin 81 mg daily  *If you need a refill on your cardiac medications before your next appointment, please call your pharmacy*  Testing/Procedures: Your physician has requested that you have an exercise stress myoview. For further information please visit [email or web address] Please follow instruction sheet, as given.  Your physician has requested that you have an echocardiogram. Echocardiography is a painless test that uses sound waves to create images of your heart. It provides your doctor with information about the size and shape of your heart and how well your heart's chambers and valves are working. This procedure takes approximately one hour. There are no restrictions for this procedure. This will be done at our [facility] location:  [address]  Follow-Up: At [facility], you and your health needs are our priority.  As part of our continuing mission to provide you with exceptional heart care, we have created designated Provider Care Teams.  These Care Teams include your primary Cardiologist (physician) and Advanced Practice Providers (APPs -  Physician Assistants and Nurse Practitioners) who all work together to provide you with the care you need, when you need it.  We recommend signing up for the patient portal called "MyChart".  Sign up information is provided on this After Visit Summary.  MyChart is used to connect with patients for Virtual Visits (Telemedicine).  Patients are able to view lab/test results, encounter notes, upcoming appointments, etc.  Non-urgent messages can be sent to your provider as well.   To learn more about what you can do with MyChart, go to [email or web address].    Your next appointment:   3 month(s)  The format for your next appointment:   In Person  Provider:   Epifanio Lesches, MD

## 2020-06-03 ENCOUNTER — Inpatient Hospital Stay (HOSPITAL_COMMUNITY): Admission: RE | Admit: 2020-06-03 | Payer: Medicare Other | Source: Ambulatory Visit

## 2020-06-04 ENCOUNTER — Telehealth: Payer: Self-pay | Admitting: Cardiology

## 2020-06-04 DIAGNOSIS — R079 Chest pain, unspecified: Secondary | ICD-10-CM

## 2020-06-04 DIAGNOSIS — I2510 Atherosclerotic heart disease of native coronary artery without angina pectoris: Secondary | ICD-10-CM

## 2020-06-04 NOTE — Telephone Encounter (Signed)
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  He is wondering if he can have the "chemical " test instead so he will not have to have the COVID screening

## 2020-06-05 ENCOUNTER — Telehealth (HOSPITAL_COMMUNITY): Payer: Self-pay | Admitting: *Deleted

## 2020-06-05 ENCOUNTER — Other Ambulatory Visit (HOSPITAL_COMMUNITY): Payer: Medicare Other

## 2020-06-05 NOTE — Telephone Encounter (Signed)
Close encounter 

## 2020-06-05 NOTE — Telephone Encounter (Signed)
That is fine, we can switch to [employer] instead

## 2020-06-06 ENCOUNTER — Telehealth: Payer: Self-pay | Admitting: Cardiology

## 2020-06-06 ENCOUNTER — Encounter (HOSPITAL_COMMUNITY): Payer: Self-pay | Admitting: Cardiology

## 2020-06-06 ENCOUNTER — Ambulatory Visit (HOSPITAL_COMMUNITY)
Admission: RE | Admit: 2020-06-06 | Payer: Medicare Other | Source: Ambulatory Visit | Attending: Cardiology | Admitting: Cardiology

## 2020-06-06 NOTE — Telephone Encounter (Signed)
Called patient to discuss rescheduling his myoview testing that was ordered by Dr. Wilkie Aye mail not activated---will keep trying to reach patient.

## 2020-06-06 NOTE — Telephone Encounter (Signed)
Order placed. See other phone note.

## 2020-06-09 NOTE — Telephone Encounter (Signed)
Spoke with patient regarding Matthew Lozano ordered by Dr. Bjorn Pippin.  Scheduled Wednesday 06/18/20 at 9:30 am at [facility] location (Dr. Campbell Lerner office).  Patient voiced his understanding

## 2020-06-10 ENCOUNTER — Encounter: Payer: Self-pay | Admitting: Cardiology

## 2020-06-10 ENCOUNTER — Telehealth (HOSPITAL_COMMUNITY): Payer: Self-pay | Admitting: Cardiology

## 2020-06-10 NOTE — Telephone Encounter (Signed)
Routed to primary nurse as FYI 

## 2020-06-10 NOTE — Telephone Encounter (Signed)
Just an FYI. We have made several attempts to contact this patient including sending a letter to schedule or reschedule theirMyoview. We will be removing the patient from the [facility].    06/06/20 NO SHOW-MAILED LETTER [facility]     Thank you

## 2020-06-12 ENCOUNTER — Telehealth: Payer: Self-pay | Admitting: *Deleted

## 2020-06-12 NOTE — Telephone Encounter (Signed)
Notes received sent to referral for scheduling. [facility] [employer] [phone]

## 2020-06-13 ENCOUNTER — Telehealth (HOSPITAL_COMMUNITY): Payer: Self-pay | Admitting: *Deleted

## 2020-06-13 NOTE — Telephone Encounter (Signed)
Close encounter 

## 2020-06-18 ENCOUNTER — Ambulatory Visit (HOSPITAL_COMMUNITY): Payer: Medicare Other

## 2020-06-18 ENCOUNTER — Encounter (HOSPITAL_COMMUNITY): Payer: Self-pay | Admitting: Cardiology

## 2020-06-18 ENCOUNTER — Other Ambulatory Visit: Payer: Self-pay

## 2020-06-18 ENCOUNTER — Ambulatory Visit (HOSPITAL_COMMUNITY)
Admission: RE | Admit: 2020-06-18 | Discharge: 2020-06-18 | Disposition: A | Discharge status: Home / Self Care | Payer: Medicare Other | Source: Ambulatory Visit | Attending: Cardiovascular Disease | Admitting: Cardiovascular Disease

## 2020-06-18 DIAGNOSIS — I2510 Atherosclerotic heart disease of native coronary artery without angina pectoris: Secondary | ICD-10-CM | POA: Diagnosis not present

## 2020-06-18 DIAGNOSIS — R079 Chest pain, unspecified: Secondary | ICD-10-CM | POA: Insufficient documentation

## 2020-06-18 LAB — MYOCARDIAL PERFUSION IMAGING
LV dias vol: 104 mL (ref 62–150)
LV sys vol: 41 mL
Peak HR: 86 {beats}/min
Rest HR: 68 {beats}/min
SDS: 2
SRS: 0
SSS: 2
TID: 0.96

## 2020-06-18 MED ORDER — TECHNETIUM TC 99M TETROFOSMIN IV KIT
30.2000 | PACK | Freq: Once | INTRAVENOUS | Status: AC | PRN
  Administered 2020-06-18: 30.2 via INTRAVENOUS
  Filled 2020-06-18: qty 31

## 2020-06-18 MED ORDER — TECHNETIUM TC 99M TETROFOSMIN IV KIT
10.5000 | PACK | Freq: Once | INTRAVENOUS | Status: AC | PRN
  Administered 2020-06-18: 10.5 via INTRAVENOUS
  Filled 2020-06-18: qty 11

## 2020-06-18 MED ORDER — REGADENOSON 0.4 MG/5ML IV SOLN
0.4000 mg | Freq: Once | INTRAVENOUS | Status: AC
  Administered 2020-06-18: 0.4 mg via INTRAVENOUS

## 2020-06-18 NOTE — Progress Notes (Unsigned)
Patient ID: Matthew Lozano, male   DOB: 06/08/1950, 70 y.o.   MRN: [id number]   Verified appointment "no show" status with Helmut Muster at 1:12pm.

## 2020-06-19 ENCOUNTER — Ambulatory Visit (HOSPITAL_COMMUNITY): Payer: Medicare Other | Attending: Internal Medicine

## 2020-06-19 DIAGNOSIS — I2510 Atherosclerotic heart disease of native coronary artery without angina pectoris: Secondary | ICD-10-CM | POA: Diagnosis not present

## 2020-06-19 DIAGNOSIS — R079 Chest pain, unspecified: Secondary | ICD-10-CM

## 2020-06-19 LAB — ECHOCARDIOGRAM COMPLETE
Area-P 1/2: 2.32 cm2
S' Lateral: 2 cm

## 2020-06-20 ENCOUNTER — Telehealth: Payer: Self-pay | Admitting: Cardiology

## 2020-06-20 DIAGNOSIS — E119 Type 2 diabetes mellitus without complications: Secondary | ICD-10-CM | POA: Diagnosis not present

## 2020-06-20 DIAGNOSIS — E7800 Pure hypercholesterolemia, unspecified: Secondary | ICD-10-CM | POA: Diagnosis not present

## 2020-06-20 DIAGNOSIS — G8929 Other chronic pain: Secondary | ICD-10-CM | POA: Diagnosis not present

## 2020-06-20 DIAGNOSIS — E118 Type 2 diabetes mellitus with unspecified complications: Secondary | ICD-10-CM | POA: Diagnosis not present

## 2020-06-20 DIAGNOSIS — E785 Hyperlipidemia, unspecified: Secondary | ICD-10-CM | POA: Diagnosis not present

## 2020-06-20 DIAGNOSIS — E1165 Type 2 diabetes mellitus with hyperglycemia: Secondary | ICD-10-CM | POA: Diagnosis not present

## 2020-06-20 DIAGNOSIS — I2510 Atherosclerotic heart disease of native coronary artery without angina pectoris: Secondary | ICD-10-CM | POA: Diagnosis not present

## 2020-06-20 NOTE — Telephone Encounter (Signed)
Follow Up:    Pt said he would like for the doctor nurse to call and discuss his Echo results from yesterday please.

## 2020-06-20 NOTE — Telephone Encounter (Signed)
Spoke to patient, advised once reviewed by MD will call with results.

## 2020-06-22 NOTE — Telephone Encounter (Signed)
There is calcification of the mitral valve, which can happen as we age.  This can lead to stenosis or regurgitation of the mitral valve.  However for him the mitral valve calcification is not causing any stenosis or regurgitation of the valve.

## 2020-06-23 ENCOUNTER — Other Ambulatory Visit: Payer: Self-pay

## 2020-06-23 ENCOUNTER — Ambulatory Visit
Admission: RE | Admit: 2020-06-23 | Discharge: 2020-06-23 | Disposition: A | Discharge status: Home / Self Care | Payer: Medicare Other | Source: Ambulatory Visit | Attending: Family Medicine | Admitting: Family Medicine

## 2020-06-23 DIAGNOSIS — R42 Dizziness and giddiness: Secondary | ICD-10-CM | POA: Diagnosis not present

## 2020-06-23 DIAGNOSIS — R519 Headache, unspecified: Secondary | ICD-10-CM | POA: Diagnosis not present

## 2020-06-23 NOTE — Telephone Encounter (Signed)
Discussed with patient-verbalized understanding.

## 2020-07-24 DIAGNOSIS — E785 Hyperlipidemia, unspecified: Secondary | ICD-10-CM | POA: Diagnosis not present

## 2020-07-24 DIAGNOSIS — E119 Type 2 diabetes mellitus without complications: Secondary | ICD-10-CM | POA: Diagnosis not present

## 2020-07-24 DIAGNOSIS — E118 Type 2 diabetes mellitus with unspecified complications: Secondary | ICD-10-CM | POA: Diagnosis not present

## 2020-07-24 DIAGNOSIS — E7800 Pure hypercholesterolemia, unspecified: Secondary | ICD-10-CM | POA: Diagnosis not present

## 2020-07-24 DIAGNOSIS — I2510 Atherosclerotic heart disease of native coronary artery without angina pectoris: Secondary | ICD-10-CM | POA: Diagnosis not present

## 2020-07-24 DIAGNOSIS — G8929 Other chronic pain: Secondary | ICD-10-CM | POA: Diagnosis not present

## 2020-07-24 DIAGNOSIS — E1165 Type 2 diabetes mellitus with hyperglycemia: Secondary | ICD-10-CM | POA: Diagnosis not present

## 2020-08-24 NOTE — Progress Notes (Signed)
Cardiology Office Note:    Date:  08/26/2020   ID:  Matthew Lozano, DOB [birth year], MRN [id number]  PCP:  Hulan Fess, MD  Cardiologist:  No primary care provider on file.  Electrophysiologist:  None   Referring MD: Hulan Fess, MD   Chief Complaint  Patient presents with  . Follow-up  . Coronary Artery Disease    History of Present Illness:    Matthew Lozano is a 70 y.o. male with a hx of T2DM, hypertension, hyperlipidemia, nonobstructive CAD who is referred by Dr. Rex Kras for evaluation of chest pain.  Underwent coronary CTA on 01/01/2017, showed calcium score 618 (84th percentile), mild disease in LAD, diffuse at least moderate disease in mid RCA.  CT FFR did not show any significant stenosis, 0.89 in LAD, 0.88 in RCA.  Echocardiogram 01/02/2017 showed EF 55 to 00%, grade 1 diastolic dysfunction, no significant valvular disease.  Cardiac catheterization on 01/11/2017 showed 20% mid LAD, 30% ostial ramus, 10% OM1, 20% proximal to mid RCA stenosis.  He reports his chest pain started months prior.  Reports can occur throughout his whole chest.  Occurs daily.  Describes dull aching pain, 1 out of 10 in intensity.  Has not noted what brings it on.  Does not seem to be related to exertion.  States that he lifts weights daily and does not cause chest pain.  No shortness of breath.  Does report dizzy spells, but has vertigo.  Denies any syncope.  Denies any lower extremity edema.  Reports occasional palpitations that last for few seconds about once per month.  Daily marijuana use.  Former smoker.  Father had CVA in 60s.  Echocardiogram on 06/19/20 showed normal biventricular function, severe MAC but no MR/MS.  Lexiscan Myoview on 06/18/20 showed perfusion, EF 60%.  Since last clinic visit, reports he has been doing well.  Denies any chest pain, dyspnea, lower extremity edema.  Having lightheadedness but denies any syncope.  Reports palpitations only when he drinks too much caffeine.  Lasts for 10  seconds or so and resolves.  Lifts weights and does stretching every day for 15 to 20 minutes.  Denies any exertional symptoms.  Continues to smoke marijuana daily.    Past Medical History:  Diagnosis Date  . GERD (gastroesophageal reflux disease)   . Hemorrhoids   . MVP (mitral valve prolapse)   . Type II diabetes mellitus ([place]) dx'd 2008  . Viral meningitis 2001    Past Surgical History:  Procedure Laterality Date  . LEFT HEART CATH AND CORONARY ANGIOGRAPHY N/A 01/11/2017   Procedure: LEFT HEART CATH AND CORONARY ANGIOGRAPHY;  Surgeon: Martinique, Peter M, MD;  Location: [place] [facility];  Service: Cardiovascular;  Laterality: N/A;  . LIPOMA EXCISION  2001   OFF BACK     Current Medications: Current Meds  Medication Sig  . JANUVIA 100 MG tablet Take 100 mg by mouth daily.  Marland Kitchen JARDIANCE 25 MG TABS tablet Take 25 mg by mouth daily.     Allergies:   Atorvastatin, Glimepiride, Metformin and related, Other, Tramadol hcl, Androgel [testosterone], Codeine, and Phenergan [promethazine hcl]   Social History   Socioeconomic History  . Marital status: Divorced    Spouse name: Not on file  . Number of children: Not on file  . Years of education: Not on file  . Highest education level: Not on file  Occupational History  . Not on file  Tobacco Use  . Smoking status: Former Smoker    Packs/day:  0.50    Years: 10.00    Pack years: 5.00    Types: Cigarettes    Quit date: 35    Years since quitting: 35.2  . Smokeless tobacco: Never Used  Vaping Use  . Vaping Use: Never used  Substance and Sexual Activity  . Alcohol use: Yes    Alcohol/week: 2.0 - 3.0 standard drinks    Types: 2 - 3 Cans of beer per week    Comment: 12/31/2016 "stopped 11/2016"  . Drug use: Yes    Types: Marijuana    Comment: 12/31/2016 "weekly"  . Sexual activity: Not Currently  Other Topics Concern  . Not on file  Social History Narrative  . Not on file   Social Determinants of Health   Financial  Resource Strain: Not on file  Food Insecurity: Not on file  Transportation Needs: Not on file  Physical Activity: Not on file  Stress: Not on file  Social Connections: Not on file     Family History: The patient's family history includes CVA in his father; Other in his brother.  ROS:   Please see the history of present illness.     All other systems reviewed and are negative.  EKGs/Labs/Other Studies Reviewed:    The following studies were reviewed today:   EKG:  EKG is not ordered today.  The ekg ordered at prior c;inic visit demonstrates normal sinus rhythm, rate 66, no ST/T abnormalities  Recent Labs: No results found for requested labs within last 8760 hours.  Recent Lipid Panel    Component Value Date/Time   CHOL 146 01/01/2017 0408   TRIG 69 01/01/2017 0408   HDL 50 01/01/2017 0408   CHOLHDL 2.9 01/01/2017 0408   VLDL 14 01/01/2017 0408   LDLCALC 82 01/01/2017 0408    Physical Exam:    VS:  BP 102/62   Pulse 86   Ht _0  (1.803 m)   Wt 163 lb 6.4 oz (74.1 kg)   SpO2 97%   BMI 22.79 kg/m     Wt Readings from Last 3 Encounters:  08/26/20 163 lb 6.4 oz (74.1 kg)  06/18/20 164 lb (74.4 kg)  05/29/20 164 lb 12.8 oz (74.8 kg)     GEN:  Well nourished, well developed in no acute distress HEENT: Normal NECK: No JVD; No carotid bruits CARDIAC: RRR, no murmurs, rubs, gallops RESPIRATORY:  Clear to auscultation without rales, wheezing or rhonchi  ABDOMEN: Soft, non-tender, non-distended MUSCULOSKELETAL:  No edema; No deformity  SKIN: Warm and dry NEUROLOGIC:  Alert and oriented x 3 PSYCHIATRIC:  Normal affect   ASSESSMENT:    1. CAD in native artery   2. Hyperlipidemia, unspecified hyperlipidemia type   3. Marijuana use    PLAN:    CAD: Underwent coronary CTA on 01/01/2017, showed calcium score 618 (84th percentile), mild disease in LAD, diffuse at least moderate disease in mid RCA.  CT FFR did not show any significant stenosis, 0.89 in LAD, 0.88 in  RCA.  Echocardiogram 01/02/2017 showed EF 55 to 17%, grade 1 diastolic dysfunction, no significant valvular disease.  Cardiac catheterization on 01/11/2017 showed 20% mid LAD, 30% ostial ramus, 10% OM1, 20% proximal to mid RCA stenosis.  Reported atypical chest pain.  Echocardiogram on 06/19/20 showed normal biventricular function, severe MAC but no MR/MS.  Lexiscan Myoview on 06/18/20 showed perfusion, EF 60%.   -Reports no recent chest pain. -Reports previously did not tolerate statin due to significant fatigue previously though is willing to try  again.  At last clinic appointment was prescribed rosuvastatin 5 mg daily but he never started.  He is agreeable to starting.  LDL 76 in June 2021, goal less than 70 -Reports he stopped daily aspirin, recommend restarting aspirin 81 mg daily  T2DM: On januvia and Jardiance.  A1c 7.5% on 05/28/2020.  Hyperlipidemia: LDL 76 on 11/21/2019.  Reported previous intolerance to statins due to feeling significant fatigue.  Starting rosuvastatin 5 mg daily.  If unable to tolerate, will refer to lipid clinic for evaluation  Marijuana use: Patient counseled on risks of marijuana use and cessation recommended.   RTC in 6 months   Medication Adjustments/Labs and Tests Ordered: Current medicines are reviewed at length with the patient today.  Concerns regarding medicines are outlined above.  No orders of the defined types were placed in this encounter.  Meds ordered this encounter  Medications  . aspirin EC 81 MG tablet    Sig: Take 1 tablet (81 mg total) by mouth daily. Swallow whole.    Dispense:  90 tablet    Refill:  3  . rosuvastatin (CRESTOR) 5 MG tablet    Sig: Take 1 tablet (5 mg total) by mouth daily.    Dispense:  90 tablet    Refill:  3    Patient Instructions  Medication Instructions:  RESTART Aspirin 81 mg daily  RESTART rosuvastatin (Crestor) 5 mg daily  *If you need a refill on your cardiac medications before your next appointment, please call  your pharmacy*  Follow-Up: At [facility], you and your health needs are our priority.  As part of our continuing mission to provide you with exceptional heart care, we have created designated Provider Care Teams.  These Care Teams include your primary Cardiologist (physician) and Advanced Practice Providers (APPs -  Physician Assistants and Nurse Practitioners) who all work together to provide you with the care you need, when you need it.  We recommend signing up for the patient portal called "MyChart".  Sign up information is provided on this After Visit Summary.  MyChart is used to connect with patients for Virtual Visits (Telemedicine).  Patients are able to view lab/test results, encounter notes, upcoming appointments, etc.  Non-urgent messages can be sent to your provider as well.   To learn more about what you can do with MyChart, go to [email or web address].    Your next appointment:   6 month(s)  The format for your next appointment:   In Person  Provider:   Oswaldo Milian, MD     n    Signed, Donato Heinz, MD  08/26/2020 8:14 PM    [facility]

## 2020-08-26 ENCOUNTER — Ambulatory Visit (INDEPENDENT_AMBULATORY_CARE_PROVIDER_SITE_OTHER): Payer: Medicare Other | Admitting: Cardiology

## 2020-08-26 ENCOUNTER — Other Ambulatory Visit: Payer: Self-pay

## 2020-08-26 ENCOUNTER — Encounter: Payer: Self-pay | Admitting: Cardiology

## 2020-08-26 VITALS — BP 102/62 | HR 86 | Ht 71.0 in | Wt 163.4 lb

## 2020-08-26 DIAGNOSIS — F1290 Cannabis use, unspecified, uncomplicated: Secondary | ICD-10-CM

## 2020-08-26 DIAGNOSIS — E785 Hyperlipidemia, unspecified: Secondary | ICD-10-CM | POA: Diagnosis not present

## 2020-08-26 DIAGNOSIS — I2510 Atherosclerotic heart disease of native coronary artery without angina pectoris: Secondary | ICD-10-CM | POA: Diagnosis not present

## 2020-08-26 MED ORDER — ASPIRIN EC 81 MG PO TBEC: 81.0000 mg | DELAYED_RELEASE_TABLET | Freq: Every day | ORAL | 3 refills | Status: AC

## 2020-08-26 MED ORDER — ROSUVASTATIN CALCIUM 5 MG PO TABS: 5.0000 mg | ORAL_TABLET | Freq: Every day | ORAL | 3 refills | Status: AC

## 2020-08-26 NOTE — Patient Instructions (Addendum)
Medication Instructions:  RESTART Aspirin 81 mg daily  RESTART rosuvastatin (Crestor) 5 mg daily  *If you need a refill on your cardiac medications before your next appointment, please call your pharmacy*  Follow-Up: At [facility], you and your health needs are our priority.  As part of our continuing mission to provide you with exceptional heart care, we have created designated Provider Care Teams.  These Care Teams include your primary Cardiologist (physician) and Advanced Practice Providers (APPs -  Physician Assistants and Nurse Practitioners) who all work together to provide you with the care you need, when you need it.  We recommend signing up for the patient portal called "MyChart".  Sign up information is provided on this After Visit Summary.  MyChart is used to connect with patients for Virtual Visits (Telemedicine).  Patients are able to view lab/test results, encounter notes, upcoming appointments, etc.  Non-urgent messages can be sent to your provider as well.   To learn more about what you can do with MyChart, go to [email or web address].    Your next appointment:   6 month(s)  The format for your next appointment:   In Person  Provider:   Epifanio Lesches, MD     n

## 2020-09-18 DIAGNOSIS — L7211 Pilar cyst: Secondary | ICD-10-CM | POA: Diagnosis not present

## 2020-09-22 DIAGNOSIS — I2584 Coronary atherosclerosis due to calcified coronary lesion: Secondary | ICD-10-CM | POA: Diagnosis not present

## 2020-09-22 DIAGNOSIS — I2510 Atherosclerotic heart disease of native coronary artery without angina pectoris: Secondary | ICD-10-CM | POA: Diagnosis not present

## 2020-09-23 DIAGNOSIS — E1165 Type 2 diabetes mellitus with hyperglycemia: Secondary | ICD-10-CM | POA: Diagnosis not present

## 2020-09-23 DIAGNOSIS — E7800 Pure hypercholesterolemia, unspecified: Secondary | ICD-10-CM | POA: Diagnosis not present

## 2020-09-23 DIAGNOSIS — E119 Type 2 diabetes mellitus without complications: Secondary | ICD-10-CM | POA: Diagnosis not present

## 2020-09-23 DIAGNOSIS — E785 Hyperlipidemia, unspecified: Secondary | ICD-10-CM | POA: Diagnosis not present

## 2020-09-23 DIAGNOSIS — I2510 Atherosclerotic heart disease of native coronary artery without angina pectoris: Secondary | ICD-10-CM | POA: Diagnosis not present

## 2020-09-23 DIAGNOSIS — E118 Type 2 diabetes mellitus with unspecified complications: Secondary | ICD-10-CM | POA: Diagnosis not present

## 2020-09-23 DIAGNOSIS — G8929 Other chronic pain: Secondary | ICD-10-CM | POA: Diagnosis not present

## 2020-11-28 DIAGNOSIS — G8929 Other chronic pain: Secondary | ICD-10-CM | POA: Diagnosis not present

## 2020-11-28 DIAGNOSIS — E7800 Pure hypercholesterolemia, unspecified: Secondary | ICD-10-CM | POA: Diagnosis not present

## 2020-11-28 DIAGNOSIS — R21 Rash and other nonspecific skin eruption: Secondary | ICD-10-CM | POA: Diagnosis not present

## 2020-11-28 DIAGNOSIS — E118 Type 2 diabetes mellitus with unspecified complications: Secondary | ICD-10-CM | POA: Diagnosis not present

## 2020-11-28 DIAGNOSIS — I2510 Atherosclerotic heart disease of native coronary artery without angina pectoris: Secondary | ICD-10-CM | POA: Diagnosis not present

## 2020-11-28 DIAGNOSIS — E1165 Type 2 diabetes mellitus with hyperglycemia: Secondary | ICD-10-CM | POA: Diagnosis not present

## 2020-11-28 DIAGNOSIS — E785 Hyperlipidemia, unspecified: Secondary | ICD-10-CM | POA: Diagnosis not present

## 2020-12-21 DIAGNOSIS — U071 COVID-19: Secondary | ICD-10-CM | POA: Diagnosis not present

## 2021-01-05 DIAGNOSIS — E1165 Type 2 diabetes mellitus with hyperglycemia: Secondary | ICD-10-CM | POA: Diagnosis not present

## 2021-01-05 DIAGNOSIS — E785 Hyperlipidemia, unspecified: Secondary | ICD-10-CM | POA: Diagnosis not present

## 2021-01-14 DIAGNOSIS — E785 Hyperlipidemia, unspecified: Secondary | ICD-10-CM | POA: Diagnosis not present

## 2021-01-14 DIAGNOSIS — E1165 Type 2 diabetes mellitus with hyperglycemia: Secondary | ICD-10-CM | POA: Diagnosis not present

## 2021-01-14 DIAGNOSIS — I2510 Atherosclerotic heart disease of native coronary artery without angina pectoris: Secondary | ICD-10-CM | POA: Diagnosis not present

## 2021-03-26 DIAGNOSIS — I2510 Atherosclerotic heart disease of native coronary artery without angina pectoris: Secondary | ICD-10-CM | POA: Diagnosis not present

## 2021-03-26 DIAGNOSIS — I2584 Coronary atherosclerosis due to calcified coronary lesion: Secondary | ICD-10-CM | POA: Diagnosis not present

## 2021-03-26 DIAGNOSIS — E119 Type 2 diabetes mellitus without complications: Secondary | ICD-10-CM | POA: Diagnosis not present

## 2021-04-08 DIAGNOSIS — E785 Hyperlipidemia, unspecified: Secondary | ICD-10-CM | POA: Diagnosis not present

## 2021-04-08 DIAGNOSIS — E1165 Type 2 diabetes mellitus with hyperglycemia: Secondary | ICD-10-CM | POA: Diagnosis not present

## 2021-04-15 DIAGNOSIS — E785 Hyperlipidemia, unspecified: Secondary | ICD-10-CM | POA: Diagnosis not present

## 2021-04-15 DIAGNOSIS — I2510 Atherosclerotic heart disease of native coronary artery without angina pectoris: Secondary | ICD-10-CM | POA: Diagnosis not present

## 2021-04-15 DIAGNOSIS — E1165 Type 2 diabetes mellitus with hyperglycemia: Secondary | ICD-10-CM | POA: Diagnosis not present

## 2021-10-16 DIAGNOSIS — I2510 Atherosclerotic heart disease of native coronary artery without angina pectoris: Secondary | ICD-10-CM | POA: Diagnosis not present

## 2021-10-16 DIAGNOSIS — I2584 Coronary atherosclerosis due to calcified coronary lesion: Secondary | ICD-10-CM | POA: Diagnosis not present

## 2021-10-16 DIAGNOSIS — E119 Type 2 diabetes mellitus without complications: Secondary | ICD-10-CM | POA: Diagnosis not present

## 2021-11-02 DIAGNOSIS — E785 Hyperlipidemia, unspecified: Secondary | ICD-10-CM | POA: Diagnosis not present

## 2021-11-02 DIAGNOSIS — E1165 Type 2 diabetes mellitus with hyperglycemia: Secondary | ICD-10-CM | POA: Diagnosis not present

## 2021-11-03 DIAGNOSIS — E785 Hyperlipidemia, unspecified: Secondary | ICD-10-CM | POA: Diagnosis not present

## 2021-11-03 DIAGNOSIS — I2510 Atherosclerotic heart disease of native coronary artery without angina pectoris: Secondary | ICD-10-CM | POA: Diagnosis not present

## 2021-11-03 DIAGNOSIS — E1165 Type 2 diabetes mellitus with hyperglycemia: Secondary | ICD-10-CM | POA: Diagnosis not present

## 2021-11-03 DIAGNOSIS — M65359 Trigger finger, unspecified little finger: Secondary | ICD-10-CM | POA: Diagnosis not present

## 2021-11-20 DIAGNOSIS — M720 Palmar fascial fibromatosis [Dupuytren]: Secondary | ICD-10-CM | POA: Diagnosis not present

## 2021-11-20 DIAGNOSIS — M79642 Pain in left hand: Secondary | ICD-10-CM | POA: Diagnosis not present

## 2021-12-08 IMAGING — MR MR HEAD W/O CM
12 series · 48 of 48 positions shown · non-contrast
Comparison: None.

CLINICAL DATA: Dizziness

EXAM:
MRI HEAD WITHOUT CONTRAST
TECHNIQUE: Multiplanar, multiecho pulse sequences of the brain and surrounding
structures were obtained without intravenous contrast.

[Series 5: T1 · sagittal · 4.0mm · 0.75mm/px · 3 of 31 slices shown (1 of 2)]
[im 1/31]
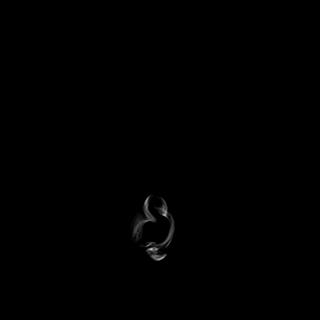
[im 16/31]
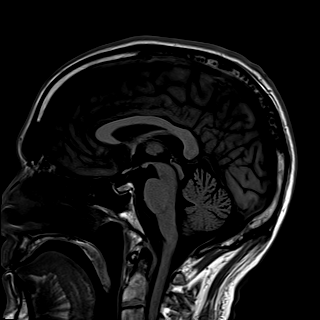
[im 31/31]
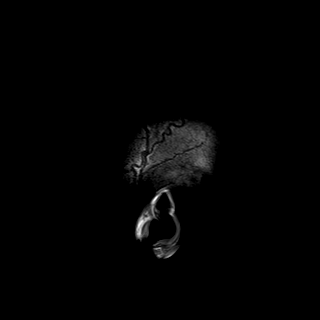

[Series 6: DWI · axial · 3.0mm · 0.94mm/px · z∈[-58,+81]mm · 8 of 160 slices shown (1 of 2)]
[im 1/160]
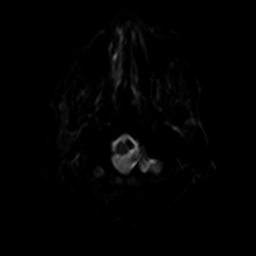
[im 23/160]
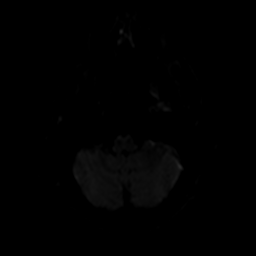
[im 46/160]
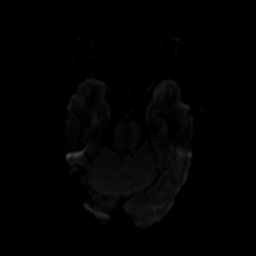
[im 69/160]
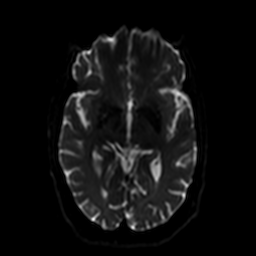
[im 91/160]
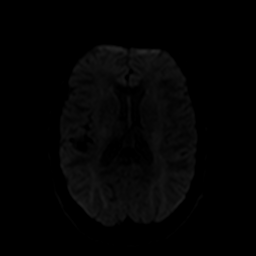
[im 114/160]
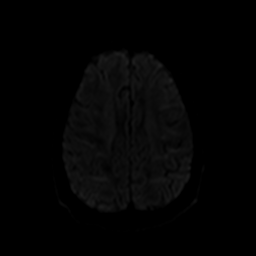
[im 137/160]
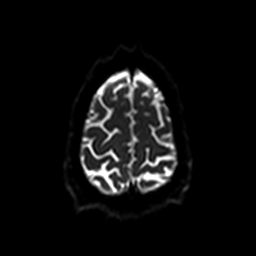
[im 160/160]
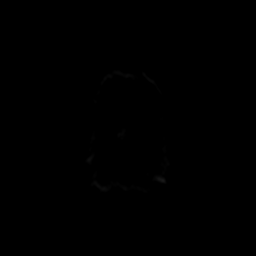

[Series 7: ax dwi_tracew · axial · 3.0mm · 0.94mm/px · z∈[-58,+81]mm · 4 of 80 slices shown]
[im 1/80]
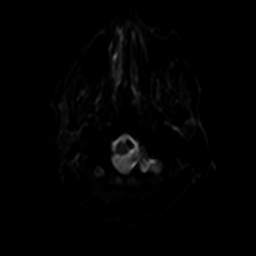
[im 27/80]
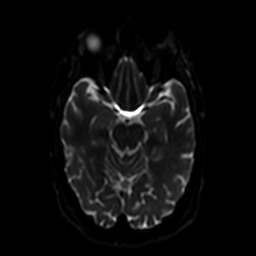
[im 53/80]
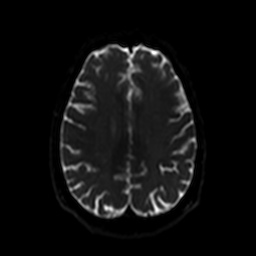
[im 80/80]
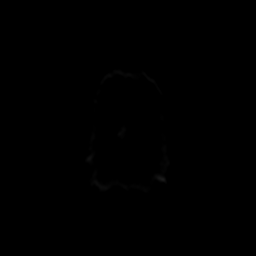

[Series 8: ax dwi_adc · axial · 3.0mm · 0.94mm/px · z∈[-58,+81]mm · 2 of 37 slices shown]
[im 1/37]
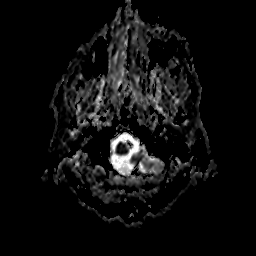
[im 37/37]
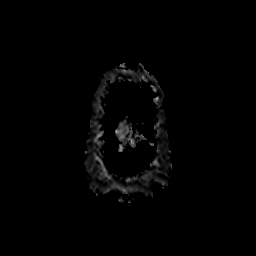

[Series 9: DWI · coronal · 4.0mm · 1.02mm/px · 8 of 146 slices shown (2 of 2)]
[im 1/146]
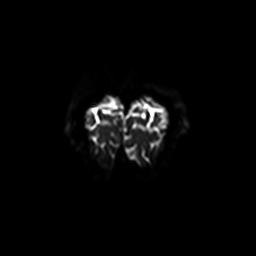
[im 21/146]
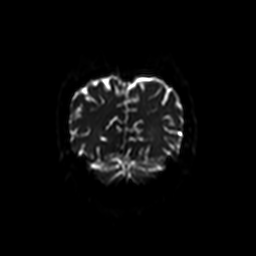
[im 42/146]
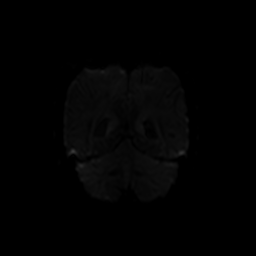
[im 63/146]
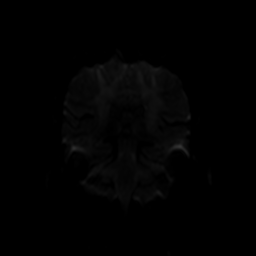
[im 83/146]
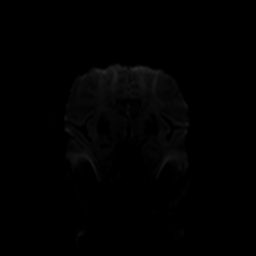
[im 104/146]
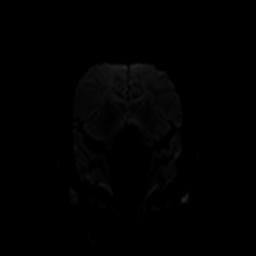
[im 125/146]
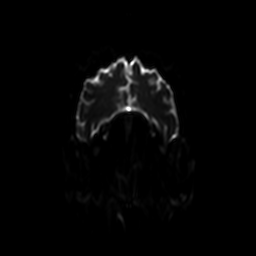
[im 146/146]
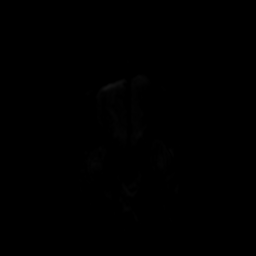

[Series 10: cor dwi_tracew · coronal · 4.0mm · 1.02mm/px · 4 of 74 slices shown]
[im 1/74]
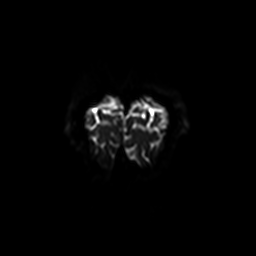
[im 25/74]
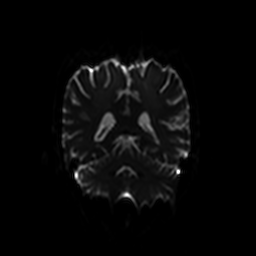
[im 49/74]
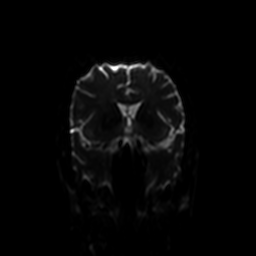
[im 74/74]
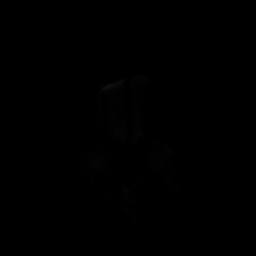

[Series 11: cor dwi_adc · coronal · 4.0mm · 1.02mm/px · 2 of 38 slices shown]
[im 1/38]
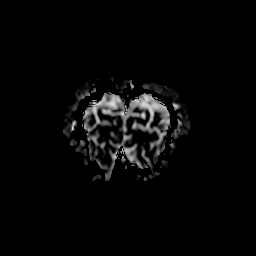
[im 38/38]
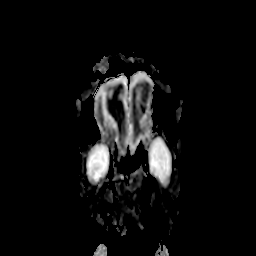

[Series 12: T2 · axial · 4.0mm · 0.36mm/px · 1 of 27 slices shown (1 of 2)]
[im 1/27]
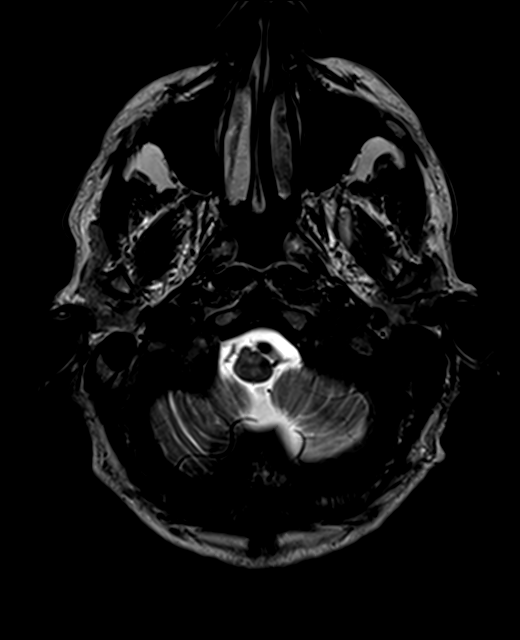

[Series 13: FLAIR · axial · 3.0mm · 0.72mm/px · 1 of 26 slices shown]
[im 1/26]
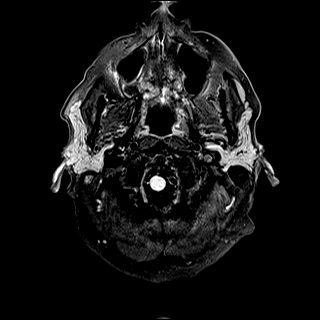

[Series 15: swi_images · axial · 1.5mm · 0.90mm/px · z∈[-63,+78]mm · 5 of 96 slices shown]
[im 1/96]
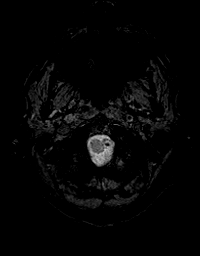
[im 24/96]
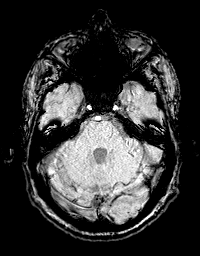
[im 48/96]
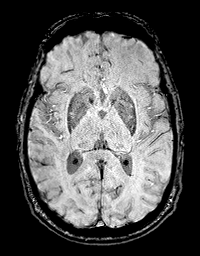
[im 72/96]
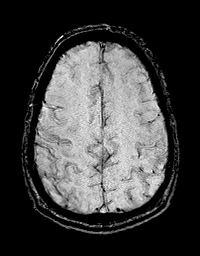
[im 96/96]
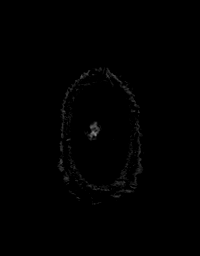

[Series 16: T1 · axial · 1.0mm · 0.94mm/px · z∈[-71,+87]mm · 8 of 160 slices shown (2 of 2)]
[im 1/160]
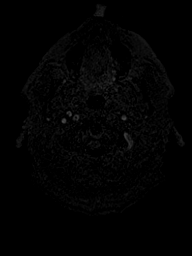
[im 23/160]
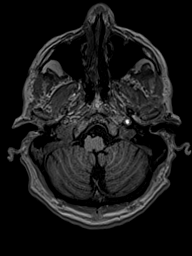
[im 46/160]
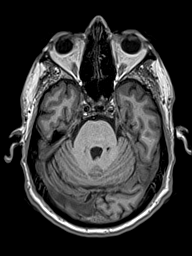
[im 69/160]
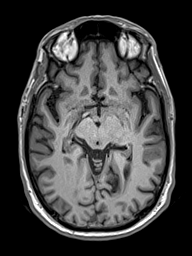
[im 91/160]
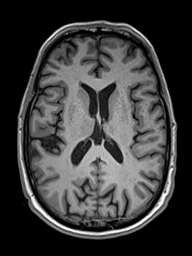
[im 114/160]
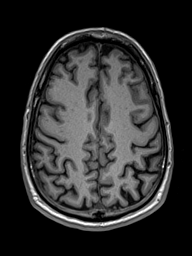
[im 137/160]
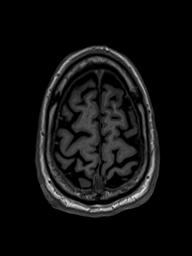
[im 160/160]
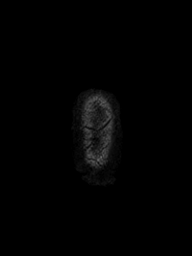

[Series 17: T2 · coronal · 4.5mm · 0.36mm/px · 2 of 30 slices shown (2 of 2)]
[im 1/30]
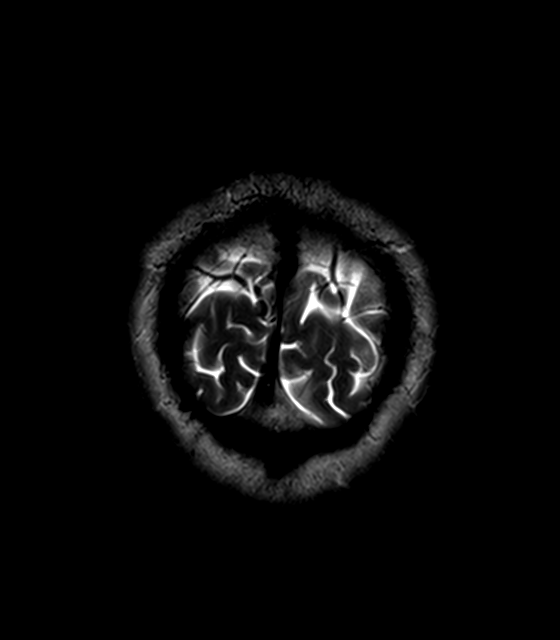
[im 30/30]
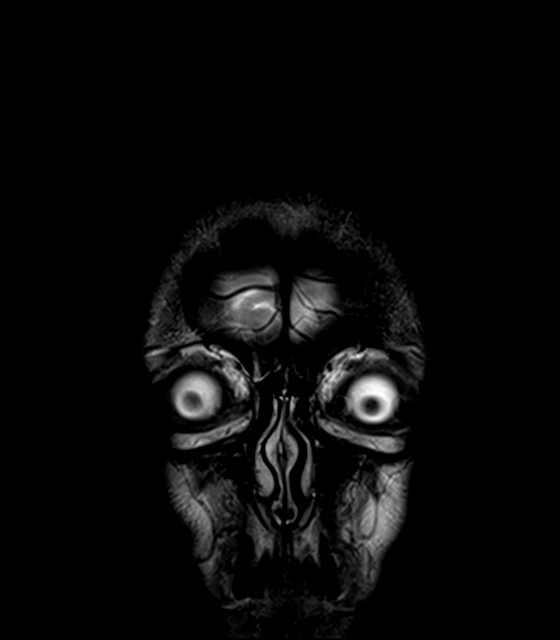

[48 of 48 positions shown; findings below may reference images not displayed]

FINDINGS: Brain: There is no acute infarction or intracranial hemorrhage.
There is no intracranial mass, mass effect, or edema. There is no
hydrocephalus or extra-axial fluid collection. Ventricles and sulci
are within normal limits in size and configuration. Minimal punctate
foci of T2 hyperintensity in the supratentorial white matter are
nonspecific but could reflect minor chronic microvascular ischemic
changes.

Vascular: Major vessel flow voids at the skull base are preserved.

Skull and upper cervical spine: Normal marrow signal is preserved.

Sinuses/Orbits: Paranasal sinuses are aerated. Orbits are
unremarkable.

Other: Sella is unremarkable.  Mastoid air cells are clear.
IMPRESSION: No evidence of recent infarction, hemorrhage, or mass.

## 2021-12-17 DIAGNOSIS — I2510 Atherosclerotic heart disease of native coronary artery without angina pectoris: Secondary | ICD-10-CM | POA: Diagnosis not present

## 2022-01-20 DIAGNOSIS — R072 Precordial pain: Secondary | ICD-10-CM | POA: Diagnosis not present

## 2022-01-20 DIAGNOSIS — I2510 Atherosclerotic heart disease of native coronary artery without angina pectoris: Secondary | ICD-10-CM | POA: Diagnosis not present

## 2022-01-20 DIAGNOSIS — I2584 Coronary atherosclerosis due to calcified coronary lesion: Secondary | ICD-10-CM | POA: Diagnosis not present

## 2022-01-20 DIAGNOSIS — E119 Type 2 diabetes mellitus without complications: Secondary | ICD-10-CM | POA: Diagnosis not present

## 2022-02-23 DIAGNOSIS — E119 Type 2 diabetes mellitus without complications: Secondary | ICD-10-CM | POA: Diagnosis not present

## 2022-02-23 DIAGNOSIS — Z7984 Long term (current) use of oral hypoglycemic drugs: Secondary | ICD-10-CM | POA: Diagnosis not present

## 2022-02-23 DIAGNOSIS — I2510 Atherosclerotic heart disease of native coronary artery without angina pectoris: Secondary | ICD-10-CM | POA: Diagnosis not present

## 2022-02-23 DIAGNOSIS — I2584 Coronary atherosclerosis due to calcified coronary lesion: Secondary | ICD-10-CM | POA: Diagnosis not present

## 2022-02-23 DIAGNOSIS — R072 Precordial pain: Secondary | ICD-10-CM | POA: Diagnosis not present

## 2022-02-23 DIAGNOSIS — I25110 Atherosclerotic heart disease of native coronary artery with unstable angina pectoris: Secondary | ICD-10-CM | POA: Diagnosis not present

## 2022-03-04 DIAGNOSIS — Z955 Presence of coronary angioplasty implant and graft: Secondary | ICD-10-CM | POA: Diagnosis not present

## 2022-03-04 DIAGNOSIS — E785 Hyperlipidemia, unspecified: Secondary | ICD-10-CM | POA: Diagnosis not present

## 2022-03-04 DIAGNOSIS — I2510 Atherosclerotic heart disease of native coronary artery without angina pectoris: Secondary | ICD-10-CM | POA: Diagnosis not present

## 2022-03-04 DIAGNOSIS — E1165 Type 2 diabetes mellitus with hyperglycemia: Secondary | ICD-10-CM | POA: Diagnosis not present

## 2022-03-04 DIAGNOSIS — E119 Type 2 diabetes mellitus without complications: Secondary | ICD-10-CM | POA: Diagnosis not present

## 2022-03-04 DIAGNOSIS — I2584 Coronary atherosclerosis due to calcified coronary lesion: Secondary | ICD-10-CM | POA: Diagnosis not present

## 2022-03-05 DIAGNOSIS — E785 Hyperlipidemia, unspecified: Secondary | ICD-10-CM | POA: Diagnosis not present

## 2022-03-05 DIAGNOSIS — E1165 Type 2 diabetes mellitus with hyperglycemia: Secondary | ICD-10-CM | POA: Diagnosis not present

## 2022-03-05 DIAGNOSIS — I2510 Atherosclerotic heart disease of native coronary artery without angina pectoris: Secondary | ICD-10-CM | POA: Diagnosis not present

## 2022-03-18 DIAGNOSIS — E119 Type 2 diabetes mellitus without complications: Secondary | ICD-10-CM | POA: Diagnosis not present

## 2022-03-18 DIAGNOSIS — I2510 Atherosclerotic heart disease of native coronary artery without angina pectoris: Secondary | ICD-10-CM | POA: Diagnosis not present

## 2022-03-18 DIAGNOSIS — Z955 Presence of coronary angioplasty implant and graft: Secondary | ICD-10-CM | POA: Diagnosis not present

## 2022-03-18 DIAGNOSIS — I2584 Coronary atherosclerosis due to calcified coronary lesion: Secondary | ICD-10-CM | POA: Diagnosis not present

## 2022-03-29 DIAGNOSIS — Z955 Presence of coronary angioplasty implant and graft: Secondary | ICD-10-CM | POA: Diagnosis not present

## 2022-03-29 DIAGNOSIS — Z48812 Encounter for surgical aftercare following surgery on the circulatory system: Secondary | ICD-10-CM | POA: Diagnosis not present

## 2022-03-31 DIAGNOSIS — Z48812 Encounter for surgical aftercare following surgery on the circulatory system: Secondary | ICD-10-CM | POA: Diagnosis not present

## 2022-03-31 DIAGNOSIS — Z955 Presence of coronary angioplasty implant and graft: Secondary | ICD-10-CM | POA: Diagnosis not present

## 2022-04-01 DIAGNOSIS — Z955 Presence of coronary angioplasty implant and graft: Secondary | ICD-10-CM | POA: Diagnosis not present

## 2022-04-01 DIAGNOSIS — Z48812 Encounter for surgical aftercare following surgery on the circulatory system: Secondary | ICD-10-CM | POA: Diagnosis not present

## 2022-04-05 DIAGNOSIS — Z955 Presence of coronary angioplasty implant and graft: Secondary | ICD-10-CM | POA: Diagnosis not present

## 2022-04-05 DIAGNOSIS — Z48812 Encounter for surgical aftercare following surgery on the circulatory system: Secondary | ICD-10-CM | POA: Diagnosis not present

## 2022-04-07 DIAGNOSIS — Z48812 Encounter for surgical aftercare following surgery on the circulatory system: Secondary | ICD-10-CM | POA: Diagnosis not present

## 2022-04-07 DIAGNOSIS — Z955 Presence of coronary angioplasty implant and graft: Secondary | ICD-10-CM | POA: Diagnosis not present

## 2022-04-08 DIAGNOSIS — Z48812 Encounter for surgical aftercare following surgery on the circulatory system: Secondary | ICD-10-CM | POA: Diagnosis not present

## 2022-04-08 DIAGNOSIS — Z955 Presence of coronary angioplasty implant and graft: Secondary | ICD-10-CM | POA: Diagnosis not present

## 2022-04-09 DIAGNOSIS — I2584 Coronary atherosclerosis due to calcified coronary lesion: Secondary | ICD-10-CM | POA: Diagnosis not present

## 2022-04-09 DIAGNOSIS — I25118 Atherosclerotic heart disease of native coronary artery with other forms of angina pectoris: Secondary | ICD-10-CM | POA: Diagnosis not present

## 2022-04-09 DIAGNOSIS — E119 Type 2 diabetes mellitus without complications: Secondary | ICD-10-CM | POA: Diagnosis not present

## 2022-04-09 DIAGNOSIS — R072 Precordial pain: Secondary | ICD-10-CM | POA: Diagnosis not present

## 2022-04-09 DIAGNOSIS — I2510 Atherosclerotic heart disease of native coronary artery without angina pectoris: Secondary | ICD-10-CM | POA: Diagnosis not present

## 2022-04-09 DIAGNOSIS — Z955 Presence of coronary angioplasty implant and graft: Secondary | ICD-10-CM | POA: Diagnosis not present

## 2022-04-12 DIAGNOSIS — Z955 Presence of coronary angioplasty implant and graft: Secondary | ICD-10-CM | POA: Diagnosis not present

## 2022-04-12 DIAGNOSIS — Z48812 Encounter for surgical aftercare following surgery on the circulatory system: Secondary | ICD-10-CM | POA: Diagnosis not present

## 2022-04-14 DIAGNOSIS — Z48812 Encounter for surgical aftercare following surgery on the circulatory system: Secondary | ICD-10-CM | POA: Diagnosis not present

## 2022-04-14 DIAGNOSIS — Z955 Presence of coronary angioplasty implant and graft: Secondary | ICD-10-CM | POA: Diagnosis not present

## 2022-04-26 DIAGNOSIS — Z48812 Encounter for surgical aftercare following surgery on the circulatory system: Secondary | ICD-10-CM | POA: Diagnosis not present

## 2022-04-26 DIAGNOSIS — Z955 Presence of coronary angioplasty implant and graft: Secondary | ICD-10-CM | POA: Diagnosis not present

## 2022-04-28 DIAGNOSIS — Z48812 Encounter for surgical aftercare following surgery on the circulatory system: Secondary | ICD-10-CM | POA: Diagnosis not present

## 2022-04-28 DIAGNOSIS — Z955 Presence of coronary angioplasty implant and graft: Secondary | ICD-10-CM | POA: Diagnosis not present

## 2022-05-12 DIAGNOSIS — Z955 Presence of coronary angioplasty implant and graft: Secondary | ICD-10-CM | POA: Diagnosis not present

## 2022-05-12 DIAGNOSIS — Z48812 Encounter for surgical aftercare following surgery on the circulatory system: Secondary | ICD-10-CM | POA: Diagnosis not present

## 2022-05-13 DIAGNOSIS — Z48812 Encounter for surgical aftercare following surgery on the circulatory system: Secondary | ICD-10-CM | POA: Diagnosis not present

## 2022-05-13 DIAGNOSIS — Z955 Presence of coronary angioplasty implant and graft: Secondary | ICD-10-CM | POA: Diagnosis not present

## 2022-05-19 DIAGNOSIS — Z48812 Encounter for surgical aftercare following surgery on the circulatory system: Secondary | ICD-10-CM | POA: Diagnosis not present

## 2022-05-19 DIAGNOSIS — Z955 Presence of coronary angioplasty implant and graft: Secondary | ICD-10-CM | POA: Diagnosis not present

## 2022-05-20 DIAGNOSIS — Z955 Presence of coronary angioplasty implant and graft: Secondary | ICD-10-CM | POA: Diagnosis not present

## 2022-05-20 DIAGNOSIS — Z48812 Encounter for surgical aftercare following surgery on the circulatory system: Secondary | ICD-10-CM | POA: Diagnosis not present

## 2022-05-26 DIAGNOSIS — Z955 Presence of coronary angioplasty implant and graft: Secondary | ICD-10-CM | POA: Diagnosis not present

## 2022-05-26 DIAGNOSIS — Z48812 Encounter for surgical aftercare following surgery on the circulatory system: Secondary | ICD-10-CM | POA: Diagnosis not present

## 2022-05-27 DIAGNOSIS — Z48812 Encounter for surgical aftercare following surgery on the circulatory system: Secondary | ICD-10-CM | POA: Diagnosis not present

## 2022-05-27 DIAGNOSIS — Z955 Presence of coronary angioplasty implant and graft: Secondary | ICD-10-CM | POA: Diagnosis not present

## 2022-05-31 DIAGNOSIS — Z48812 Encounter for surgical aftercare following surgery on the circulatory system: Secondary | ICD-10-CM | POA: Diagnosis not present

## 2022-05-31 DIAGNOSIS — Z955 Presence of coronary angioplasty implant and graft: Secondary | ICD-10-CM | POA: Diagnosis not present

## 2022-06-02 DIAGNOSIS — Z955 Presence of coronary angioplasty implant and graft: Secondary | ICD-10-CM | POA: Diagnosis not present

## 2022-06-02 DIAGNOSIS — Z48812 Encounter for surgical aftercare following surgery on the circulatory system: Secondary | ICD-10-CM | POA: Diagnosis not present

## 2022-06-03 DIAGNOSIS — Z955 Presence of coronary angioplasty implant and graft: Secondary | ICD-10-CM | POA: Diagnosis not present

## 2022-06-03 DIAGNOSIS — Z48812 Encounter for surgical aftercare following surgery on the circulatory system: Secondary | ICD-10-CM | POA: Diagnosis not present

## 2022-06-07 DIAGNOSIS — I2510 Atherosclerotic heart disease of native coronary artery without angina pectoris: Secondary | ICD-10-CM | POA: Diagnosis not present

## 2022-06-07 DIAGNOSIS — E785 Hyperlipidemia, unspecified: Secondary | ICD-10-CM | POA: Diagnosis not present

## 2022-06-07 DIAGNOSIS — E1165 Type 2 diabetes mellitus with hyperglycemia: Secondary | ICD-10-CM | POA: Diagnosis not present

## 2022-06-09 DIAGNOSIS — Z48812 Encounter for surgical aftercare following surgery on the circulatory system: Secondary | ICD-10-CM | POA: Diagnosis not present

## 2022-06-09 DIAGNOSIS — Z955 Presence of coronary angioplasty implant and graft: Secondary | ICD-10-CM | POA: Diagnosis not present

## 2022-06-10 DIAGNOSIS — T466X5A Adverse effect of antihyperlipidemic and antiarteriosclerotic drugs, initial encounter: Secondary | ICD-10-CM | POA: Diagnosis not present

## 2022-06-10 DIAGNOSIS — I2510 Atherosclerotic heart disease of native coronary artery without angina pectoris: Secondary | ICD-10-CM | POA: Diagnosis not present

## 2022-06-10 DIAGNOSIS — Z48812 Encounter for surgical aftercare following surgery on the circulatory system: Secondary | ICD-10-CM | POA: Diagnosis not present

## 2022-06-10 DIAGNOSIS — E785 Hyperlipidemia, unspecified: Secondary | ICD-10-CM | POA: Diagnosis not present

## 2022-06-10 DIAGNOSIS — M791 Myalgia, unspecified site: Secondary | ICD-10-CM | POA: Diagnosis not present

## 2022-06-10 DIAGNOSIS — Z955 Presence of coronary angioplasty implant and graft: Secondary | ICD-10-CM | POA: Diagnosis not present

## 2022-06-14 DIAGNOSIS — Z955 Presence of coronary angioplasty implant and graft: Secondary | ICD-10-CM | POA: Diagnosis not present

## 2022-06-14 DIAGNOSIS — Z48812 Encounter for surgical aftercare following surgery on the circulatory system: Secondary | ICD-10-CM | POA: Diagnosis not present

## 2022-06-16 DIAGNOSIS — Z955 Presence of coronary angioplasty implant and graft: Secondary | ICD-10-CM | POA: Diagnosis not present

## 2022-06-16 DIAGNOSIS — Z48812 Encounter for surgical aftercare following surgery on the circulatory system: Secondary | ICD-10-CM | POA: Diagnosis not present

## 2022-06-17 DIAGNOSIS — Z955 Presence of coronary angioplasty implant and graft: Secondary | ICD-10-CM | POA: Diagnosis not present

## 2022-06-17 DIAGNOSIS — Z48812 Encounter for surgical aftercare following surgery on the circulatory system: Secondary | ICD-10-CM | POA: Diagnosis not present

## 2022-06-21 DIAGNOSIS — Z955 Presence of coronary angioplasty implant and graft: Secondary | ICD-10-CM | POA: Diagnosis not present

## 2022-06-21 DIAGNOSIS — Z48812 Encounter for surgical aftercare following surgery on the circulatory system: Secondary | ICD-10-CM | POA: Diagnosis not present

## 2022-06-23 DIAGNOSIS — Z955 Presence of coronary angioplasty implant and graft: Secondary | ICD-10-CM | POA: Diagnosis not present

## 2022-06-23 DIAGNOSIS — Z48812 Encounter for surgical aftercare following surgery on the circulatory system: Secondary | ICD-10-CM | POA: Diagnosis not present

## 2022-06-24 DIAGNOSIS — Z955 Presence of coronary angioplasty implant and graft: Secondary | ICD-10-CM | POA: Diagnosis not present

## 2022-06-24 DIAGNOSIS — Z48812 Encounter for surgical aftercare following surgery on the circulatory system: Secondary | ICD-10-CM | POA: Diagnosis not present

## 2022-06-28 DIAGNOSIS — Z955 Presence of coronary angioplasty implant and graft: Secondary | ICD-10-CM | POA: Diagnosis not present

## 2022-06-28 DIAGNOSIS — I2510 Atherosclerotic heart disease of native coronary artery without angina pectoris: Secondary | ICD-10-CM | POA: Diagnosis not present

## 2022-06-28 DIAGNOSIS — Z48812 Encounter for surgical aftercare following surgery on the circulatory system: Secondary | ICD-10-CM | POA: Diagnosis not present

## 2022-06-28 DIAGNOSIS — I25118 Atherosclerotic heart disease of native coronary artery with other forms of angina pectoris: Secondary | ICD-10-CM | POA: Diagnosis not present

## 2022-06-28 DIAGNOSIS — Z1330 Encounter for screening examination for mental health and behavioral disorders, unspecified: Secondary | ICD-10-CM | POA: Diagnosis not present

## 2022-06-28 DIAGNOSIS — I2584 Coronary atherosclerosis due to calcified coronary lesion: Secondary | ICD-10-CM | POA: Diagnosis not present

## 2022-06-28 DIAGNOSIS — R072 Precordial pain: Secondary | ICD-10-CM | POA: Diagnosis not present

## 2022-06-28 DIAGNOSIS — E119 Type 2 diabetes mellitus without complications: Secondary | ICD-10-CM | POA: Diagnosis not present

## 2022-06-30 DIAGNOSIS — Z48812 Encounter for surgical aftercare following surgery on the circulatory system: Secondary | ICD-10-CM | POA: Diagnosis not present

## 2022-06-30 DIAGNOSIS — Z955 Presence of coronary angioplasty implant and graft: Secondary | ICD-10-CM | POA: Diagnosis not present

## 2022-07-01 DIAGNOSIS — Z48812 Encounter for surgical aftercare following surgery on the circulatory system: Secondary | ICD-10-CM | POA: Diagnosis not present

## 2022-07-01 DIAGNOSIS — Z955 Presence of coronary angioplasty implant and graft: Secondary | ICD-10-CM | POA: Diagnosis not present

## 2022-07-05 DIAGNOSIS — Z48812 Encounter for surgical aftercare following surgery on the circulatory system: Secondary | ICD-10-CM | POA: Diagnosis not present

## 2022-07-05 DIAGNOSIS — Z955 Presence of coronary angioplasty implant and graft: Secondary | ICD-10-CM | POA: Diagnosis not present

## 2022-07-07 DIAGNOSIS — Z48812 Encounter for surgical aftercare following surgery on the circulatory system: Secondary | ICD-10-CM | POA: Diagnosis not present

## 2022-07-07 DIAGNOSIS — Z955 Presence of coronary angioplasty implant and graft: Secondary | ICD-10-CM | POA: Diagnosis not present

## 2022-07-08 DIAGNOSIS — Z955 Presence of coronary angioplasty implant and graft: Secondary | ICD-10-CM | POA: Diagnosis not present

## 2022-07-08 DIAGNOSIS — Z48812 Encounter for surgical aftercare following surgery on the circulatory system: Secondary | ICD-10-CM | POA: Diagnosis not present

## 2022-09-29 DIAGNOSIS — E1165 Type 2 diabetes mellitus with hyperglycemia: Secondary | ICD-10-CM | POA: Diagnosis not present

## 2022-09-29 DIAGNOSIS — E785 Hyperlipidemia, unspecified: Secondary | ICD-10-CM | POA: Diagnosis not present

## 2022-10-06 DIAGNOSIS — E785 Hyperlipidemia, unspecified: Secondary | ICD-10-CM | POA: Diagnosis not present

## 2022-10-06 DIAGNOSIS — Z789 Other specified health status: Secondary | ICD-10-CM | POA: Diagnosis not present

## 2022-10-06 DIAGNOSIS — I2510 Atherosclerotic heart disease of native coronary artery without angina pectoris: Secondary | ICD-10-CM | POA: Diagnosis not present

## 2022-10-06 DIAGNOSIS — E1165 Type 2 diabetes mellitus with hyperglycemia: Secondary | ICD-10-CM | POA: Diagnosis not present

## 2023-02-15 DIAGNOSIS — M5416 Radiculopathy, lumbar region: Secondary | ICD-10-CM | POA: Diagnosis not present

## 2023-02-19 DIAGNOSIS — M545 Low back pain, unspecified: Secondary | ICD-10-CM | POA: Diagnosis not present

## 2023-02-28 DIAGNOSIS — M5416 Radiculopathy, lumbar region: Secondary | ICD-10-CM | POA: Diagnosis not present

## 2023-03-04 DIAGNOSIS — M5416 Radiculopathy, lumbar region: Secondary | ICD-10-CM | POA: Diagnosis not present

## 2023-03-16 DIAGNOSIS — Z955 Presence of coronary angioplasty implant and graft: Secondary | ICD-10-CM | POA: Diagnosis not present

## 2023-03-16 DIAGNOSIS — E119 Type 2 diabetes mellitus without complications: Secondary | ICD-10-CM | POA: Diagnosis not present

## 2023-03-16 DIAGNOSIS — R112 Nausea with vomiting, unspecified: Secondary | ICD-10-CM | POA: Diagnosis not present

## 2023-03-16 DIAGNOSIS — Z9641 Presence of insulin pump (external) (internal): Secondary | ICD-10-CM | POA: Diagnosis not present

## 2023-03-16 DIAGNOSIS — R197 Diarrhea, unspecified: Secondary | ICD-10-CM | POA: Diagnosis not present

## 2023-03-16 DIAGNOSIS — Z20822 Contact with and (suspected) exposure to covid-19: Secondary | ICD-10-CM | POA: Diagnosis not present

## 2023-03-16 DIAGNOSIS — Z794 Long term (current) use of insulin: Secondary | ICD-10-CM | POA: Diagnosis not present

## 2023-03-16 DIAGNOSIS — R531 Weakness: Secondary | ICD-10-CM | POA: Diagnosis not present

## 2023-03-16 DIAGNOSIS — E860 Dehydration: Secondary | ICD-10-CM | POA: Diagnosis not present

## 2023-03-16 DIAGNOSIS — I498 Other specified cardiac arrhythmias: Secondary | ICD-10-CM | POA: Diagnosis not present

## 2023-03-23 DIAGNOSIS — E1065 Type 1 diabetes mellitus with hyperglycemia: Secondary | ICD-10-CM | POA: Diagnosis not present

## 2023-03-23 DIAGNOSIS — E785 Hyperlipidemia, unspecified: Secondary | ICD-10-CM | POA: Diagnosis not present

## 2023-03-23 DIAGNOSIS — I2510 Atherosclerotic heart disease of native coronary artery without angina pectoris: Secondary | ICD-10-CM | POA: Diagnosis not present

## 2023-03-23 DIAGNOSIS — Z789 Other specified health status: Secondary | ICD-10-CM | POA: Diagnosis not present

## 2023-03-23 DIAGNOSIS — E1165 Type 2 diabetes mellitus with hyperglycemia: Secondary | ICD-10-CM | POA: Diagnosis not present

## 2023-03-23 DIAGNOSIS — Z9641 Presence of insulin pump (external) (internal): Secondary | ICD-10-CM | POA: Diagnosis not present

## 2023-03-28 DIAGNOSIS — U071 COVID-19: Secondary | ICD-10-CM | POA: Diagnosis not present

## 2023-09-12 ENCOUNTER — Encounter (HOSPITAL_BASED_OUTPATIENT_CLINIC_OR_DEPARTMENT_OTHER): Payer: Self-pay | Admitting: Emergency Medicine

## 2023-09-12 ENCOUNTER — Emergency Department (HOSPITAL_BASED_OUTPATIENT_CLINIC_OR_DEPARTMENT_OTHER)
Admission: EM | Admit: 2023-09-12 | Discharge: 2023-09-12 | Disposition: A | Discharge status: Home / Self Care | Attending: Emergency Medicine | Admitting: Emergency Medicine

## 2023-09-12 ENCOUNTER — Emergency Department (HOSPITAL_BASED_OUTPATIENT_CLINIC_OR_DEPARTMENT_OTHER)

## 2023-09-12 ENCOUNTER — Other Ambulatory Visit: Payer: Self-pay

## 2023-09-12 DIAGNOSIS — I252 Old myocardial infarction: Secondary | ICD-10-CM | POA: Insufficient documentation

## 2023-09-12 DIAGNOSIS — I2510 Atherosclerotic heart disease of native coronary artery without angina pectoris: Secondary | ICD-10-CM | POA: Insufficient documentation

## 2023-09-12 DIAGNOSIS — R0789 Other chest pain: Secondary | ICD-10-CM | POA: Diagnosis not present

## 2023-09-12 DIAGNOSIS — Z7982 Long term (current) use of aspirin: Secondary | ICD-10-CM | POA: Diagnosis not present

## 2023-09-12 DIAGNOSIS — R1013 Epigastric pain: Secondary | ICD-10-CM | POA: Insufficient documentation

## 2023-09-12 DIAGNOSIS — Z955 Presence of coronary angioplasty implant and graft: Secondary | ICD-10-CM | POA: Insufficient documentation

## 2023-09-12 LAB — CBC
HCT: 42.4 % (ref 39.0–52.0)
Hemoglobin: 15.3 g/dL (ref 13.0–17.0)
MCH: 31.7 pg (ref 26.0–34.0)
MCHC: 36.1 g/dL — ABNORMAL HIGH (ref 30.0–36.0)
MCV: 88 fL (ref 80.0–100.0)
Platelets: 215 10*3/uL (ref 150–400)
RBC: 4.82 MIL/uL (ref 4.22–5.81)
RDW: 12.3 % (ref 11.5–15.5)
WBC: 9.7 10*3/uL (ref 4.0–10.5)
nRBC: 0 % (ref 0.0–0.2)

## 2023-09-12 LAB — BASIC METABOLIC PANEL WITH GFR
Anion gap: 8 (ref 5–15)
BUN: 15 mg/dL (ref 8–23)
CO2: 26 mmol/L (ref 22–32)
Calcium: 9 mg/dL (ref 8.9–10.3)
Chloride: 102 mmol/L (ref 98–111)
Creatinine, Ser: 0.94 mg/dL (ref 0.61–1.24)
GFR, Estimated: >60 mL/min (ref >60)
Glucose, Bld: 100 mg/dL — ABNORMAL HIGH (ref 70–99)
Potassium: 3.3 mmol/L — ABNORMAL LOW (ref 3.5–5.1)
Sodium: 136 mmol/L (ref 135–145)

## 2023-09-12 LAB — TROPONIN I (HIGH SENSITIVITY)
Troponin I (High Sensitivity): 5 ng/L (ref <18)
Troponin I (High Sensitivity): 3 ng/L (ref <18)

## 2023-09-12 MED ORDER — FENTANYL CITRATE PF 50 MCG/ML IJ SOSY: 100.0000 ug | PREFILLED_SYRINGE | Freq: Once | INTRAMUSCULAR | Status: DC

## 2023-09-12 MED ORDER — PANTOPRAZOLE SODIUM 20 MG PO TBEC: 20.0000 mg | DELAYED_RELEASE_TABLET | Freq: Two times a day (BID) | ORAL | 0 refills | Status: AC

## 2023-09-12 MED ORDER — ALUM & MAG HYDROXIDE-SIMETH 200-200-20 MG/5ML PO SUSP
30.0000 mL | Freq: Once | ORAL | Status: AC
  Administered 2023-09-12: 30 mL via ORAL
  Filled 2023-09-12: qty 30

## 2023-09-12 MED ORDER — NITROGLYCERIN 0.4 MG SL SUBL
0.4000 mg | SUBLINGUAL_TABLET | SUBLINGUAL | Status: AC | PRN
  Administered 2023-09-12 (×3): 0.4 mg via SUBLINGUAL
  Filled 2023-09-12: qty 1

## 2023-09-12 MED ORDER — SUCRALFATE 1 GM/10ML PO SUSP: 1.0000 g | Freq: Three times a day (TID) | ORAL | 0 refills | Status: AC

## 2023-09-12 MED ORDER — PANTOPRAZOLE SODIUM 40 MG IV SOLR
40.0000 mg | Freq: Once | INTRAVENOUS | Status: AC
  Administered 2023-09-12: 40 mg via INTRAVENOUS
  Filled 2023-09-12: qty 10

## 2023-09-12 MED ORDER — ASPIRIN 81 MG PO CHEW
324.0000 mg | CHEWABLE_TABLET | Freq: Once | ORAL | Status: AC
  Administered 2023-09-12: 324 mg via ORAL
  Filled 2023-09-12: qty 4

## 2023-09-12 MED ORDER — LIDOCAINE VISCOUS HCL 2 % MT SOLN
15.0000 mL | Freq: Once | OROMUCOSAL | Status: AC
  Administered 2023-09-12: 15 mL via ORAL
  Filled 2023-09-12: qty 15

## 2023-09-12 NOTE — ED Provider Notes (Signed)
 [place] [facility] AT [facility] Provider Note   CSN: [id number] Arrival date & time: 09/12/23  0130     History  Chief Complaint  Patient presents with   Chest Pain    Matthew Lozano is a 73 y.o. male.  73 yo M w/ h/o CAD presents to the ED with a couple days of epigastric, lower mid chest discomfort - feels like pressure. Has been a bit light headed but no diaphoresis, dyspnea, or radiation. Feels nothing like when he had his stent placed 10/23. Not exertional. No fatigue. No trauma. No recent illnesses, fever or cough.    Chest Pain      Home Medications Prior to Admission medications   Medication Sig Start Date End Date Taking? Authorizing Provider  pantoprazole  (PROTONIX ) 20 MG tablet Take 1 tablet (20 mg total) by mouth 2 (two) times daily for 10 days. 09/12/23 09/22/23 Yes Shaunita Seney, Reymundo Caulk, MD  sucralfate  (CARAFATE ) 1 GM/10ML suspension Take 10 mLs (1 g total) by mouth 4 (four) times daily -  with meals and at bedtime. 09/12/23  Yes Labrina Lines, Reymundo Caulk, MD  aspirin  EC 81 MG tablet Take 1 tablet (81 mg total) by mouth daily. Swallow whole. 08/26/20   Wendie Hamburg, MD  JANUVIA 100 MG tablet Take 100 mg by mouth daily. 05/13/20   [provider]  JARDIANCE 25 MG TABS tablet Take 25 mg by mouth daily. 04/26/18   [provider]  rosuvastatin  (CRESTOR ) 5 MG tablet Take 1 tablet (5 mg total) by mouth daily. 08/26/20 08/21/21  Wendie Hamburg, MD      Allergies    Atorvastatin , Glimepiride, Metformin and related, Other, Tramadol  hcl, Androgel [testosterone], Codeine, and Phenergan [promethazine hcl]    Review of Systems   Review of Systems  Cardiovascular:  Positive for chest pain.    Physical Exam Updated Vital Signs BP (!) 146/89   Pulse 62   Temp 97.9 F (36.6 C) (Oral)   Resp 16   Ht 5\' 11"  (1.803 m)   Wt 74 kg   SpO2 100%   BMI 22.75 kg/m  Physical Exam Vitals and nursing note reviewed.  Constitutional:       Appearance: He is well-developed.  HENT:     Head: Normocephalic and atraumatic.  Cardiovascular:     Rate and Rhythm: Normal rate.  Pulmonary:     Effort: Pulmonary effort is normal. No tachypnea or respiratory distress.     Breath sounds: No decreased breath sounds.  Abdominal:     General: There is no distension.  Musculoskeletal:        General: Normal range of motion.     Cervical back: Normal range of motion.     Right lower leg: No edema.     Left lower leg: No edema.  Neurological:     Mental Status: He is alert.     ED Results / Procedures / Treatments   Labs (all labs ordered are listed, but only abnormal results are displayed) Labs Reviewed  BASIC METABOLIC PANEL WITH GFR - Abnormal; Notable for the following components:      Result Value   Potassium 3.3 (*)    Glucose, Bld 100 (*)    All other components within normal limits  CBC - Abnormal; Notable for the following components:   MCHC 36.1 (*)    All other components within normal limits  TROPONIN I (HIGH SENSITIVITY)  TROPONIN I (HIGH SENSITIVITY)    EKG EKG Interpretation Date/Time:  Monday September 12 2023 01:41:53 EDT Ventricular Rate:  66 PR Interval:  123 QRS Duration:  103 QT Interval:  438 QTC Calculation: 459 R Axis:   66  Text Interpretation: Sinus rhythm Baseline wander in lead(s) V6 Confirmed by Eve Hinders [phone]) on 09/12/2023 1:53:29 AM  Radiology DG Chest Port 1 View Result Date: 09/12/2023 CLINICAL DATA:  Chest pain. EXAM: PORTABLE CHEST 1 VIEW COMPARISON:  May 18, 2017 FINDINGS: The heart size and mediastinal contours are within normal limits. Both lungs are clear. Multilevel degenerative changes are noted throughout the thoracic spine. IMPRESSION: No active disease. Electronically Signed   By: Virgle Grime M.D.   On: 09/12/2023 02:24    Procedures Procedures    Medications Ordered in ED Medications  fentaNYL  (SUBLIMAZE ) injection 100 mcg (0 mcg Intravenous Hold 09/12/23  0255)  aspirin  chewable tablet 324 mg (324 mg Oral Given 09/12/23 0215)  nitroGLYCERIN  (NITROSTAT ) SL tablet 0.4 mg (0.4 mg Sublingual Given 09/12/23 0227)  pantoprazole  (PROTONIX ) injection 40 mg (40 mg Intravenous Given 09/12/23 0245)  alum & mag hydroxide-simeth (MAALOX/MYLANTA) 200-200-20 MG/5ML suspension 30 mL (30 mLs Oral Given 09/12/23 0243)    And  lidocaine  (XYLOCAINE ) 2 % viscous mouth solution 15 mL (15 mLs Oral Given 09/12/23 0242)    ED Course/ Medical Decision Making/ A&P                                 Medical Decision Making Amount and/or Complexity of Data Reviewed Labs: ordered. Radiology: ordered.  Risk OTC drugs. Prescription drug management.  Will assess for ACS. Ecg reassuring. Will try ASA/NTG first.  First trop negative, will try some GI meds to see if gastritis/esophagitis is possibly the etiology. Instant improvement in pain immediately after maalox/protonix , trops negative. Low suspicion for ACS at this time, likely gastritis/PUD. Will start antacids, pcp follow up. Return here for new/worsening symptoms.    Final Clinical Impression(s) / ED Diagnoses Final diagnoses:  Epigastric pain    Rx / DC Orders ED Discharge Orders          Ordered    pantoprazole  (PROTONIX ) 20 MG tablet  2 times daily        09/12/23 0433    sucralfate  (CARAFATE ) 1 GM/10ML suspension  3 times daily with meals & bedtime        09/12/23 0433              Champ Keetch, Reymundo Caulk, MD 09/12/23 [phone]

## 2023-09-12 NOTE — ED Triage Notes (Addendum)
 Patient here with constant central chest pain since Saturday. Non radiating, denies SHOB. Denies n/v endorses diarrhea. Hx of MI and stent placement.

## 2023-11-24 ENCOUNTER — Emergency Department (HOSPITAL_BASED_OUTPATIENT_CLINIC_OR_DEPARTMENT_OTHER)
Admission: EM | Admit: 2023-11-24 | Discharge: 2023-11-24 | Disposition: A | Discharge status: Home / Self Care | Attending: Emergency Medicine | Admitting: Emergency Medicine

## 2023-11-24 ENCOUNTER — Other Ambulatory Visit: Payer: Self-pay

## 2023-11-24 ENCOUNTER — Emergency Department (HOSPITAL_BASED_OUTPATIENT_CLINIC_OR_DEPARTMENT_OTHER)

## 2023-11-24 DIAGNOSIS — Z7984 Long term (current) use of oral hypoglycemic drugs: Secondary | ICD-10-CM | POA: Insufficient documentation

## 2023-11-24 DIAGNOSIS — K529 Noninfective gastroenteritis and colitis, unspecified: Secondary | ICD-10-CM | POA: Diagnosis not present

## 2023-11-24 DIAGNOSIS — Z7982 Long term (current) use of aspirin: Secondary | ICD-10-CM | POA: Insufficient documentation

## 2023-11-24 DIAGNOSIS — R197 Diarrhea, unspecified: Secondary | ICD-10-CM | POA: Diagnosis present

## 2023-11-24 DIAGNOSIS — E109 Type 1 diabetes mellitus without complications: Secondary | ICD-10-CM | POA: Diagnosis not present

## 2023-11-24 LAB — URINALYSIS, ROUTINE W REFLEX MICROSCOPIC
Bacteria, UA: NONE SEEN
Bilirubin Urine: NEGATIVE
Glucose, UA: NEGATIVE mg/dL
Ketones, ur: 15 mg/dL — AB
Leukocytes,Ua: NEGATIVE
Nitrite: NEGATIVE
Protein, ur: NEGATIVE mg/dL
Specific Gravity, Urine: 1.023 (ref 1.005–1.030)
pH: 5.5 (ref 5.0–8.0)

## 2023-11-24 LAB — CBC
HCT: 42.4 % (ref 39.0–52.0)
Hemoglobin: 15.3 g/dL (ref 13.0–17.0)
MCH: 31.8 pg (ref 26.0–34.0)
MCHC: 36.1 g/dL — ABNORMAL HIGH (ref 30.0–36.0)
MCV: 88.1 fL (ref 80.0–100.0)
Platelets: 163 10*3/uL (ref 150–400)
RBC: 4.81 MIL/uL (ref 4.22–5.81)
RDW: 12.3 % (ref 11.5–15.5)
WBC: 9 10*3/uL (ref 4.0–10.5)
nRBC: 0 % (ref 0.0–0.2)

## 2023-11-24 LAB — COMPREHENSIVE METABOLIC PANEL WITH GFR
ALT: 18 U/L (ref 0–44)
AST: 28 U/L (ref 15–41)
Albumin: 4.5 g/dL (ref 3.5–5.0)
Alkaline Phosphatase: 94 U/L (ref 38–126)
Anion gap: 13 (ref 5–15)
BUN: 14 mg/dL (ref 8–23)
CO2: 20 mmol/L — ABNORMAL LOW (ref 22–32)
Calcium: 9.6 mg/dL (ref 8.9–10.3)
Chloride: 99 mmol/L (ref 98–111)
Creatinine, Ser: 1 mg/dL (ref 0.61–1.24)
GFR, Estimated: >60 mL/min (ref >60)
Glucose, Bld: 170 mg/dL — ABNORMAL HIGH (ref 70–99)
Potassium: 4 mmol/L (ref 3.5–5.1)
Sodium: 132 mmol/L — ABNORMAL LOW (ref 135–145)
Total Bilirubin: 1.2 mg/dL (ref 0.0–1.2)
Total Protein: 7.5 g/dL (ref 6.5–8.1)

## 2023-11-24 LAB — LIPASE, BLOOD: Lipase: 20 U/L (ref 11–51)

## 2023-11-24 MED ORDER — ALUM & MAG HYDROXIDE-SIMETH 200-200-20 MG/5ML PO SUSP
30.0000 mL | Freq: Once | ORAL | Status: AC
  Administered 2023-11-24: 30 mL via ORAL
  Filled 2023-11-24: qty 30

## 2023-11-24 MED ORDER — SODIUM CHLORIDE 0.9 % IV BOLUS
1000.0000 mL | Freq: Once | INTRAVENOUS | Status: AC
  Administered 2023-11-24: 1000 mL via INTRAVENOUS

## 2023-11-24 MED ORDER — IOHEXOL 300 MG/ML  SOLN
100.0000 mL | Freq: Once | INTRAMUSCULAR | Status: AC | PRN
  Administered 2023-11-24: 100 mL via INTRAVENOUS

## 2023-11-24 NOTE — ED Provider Notes (Signed)
 [place] [facility] AT [facility] Provider Note   CSN: [id number] Arrival date & time: 11/24/23  1550     Patient presents with: Diarrhea   Matthew Lozano is a 73 y.o. male.   Patient presents with a year long history of diarrhea. He was nervous something was wrong with his liver so he didn't go see a provider for this diarrhea until he went to a walk in a month ago where they did a stool sample and told him he had e coli, they gave him cipro which he took but ti wasn't better and he went back and they gave him metro which he finished this past Monday.  He says he's lost 9 lbs in the last week, his appetite is poor, he has diarrhea everyday that is yellow, does not float or look greasy. He denies shortness of breath, chest pain, new back pain, changes in vision, trouble swallowing, abdominal pain, blood in stool, blood in urine. He denies changes in urination such as burning, he notes it has been darker than normal.  He has had chills at night for the last week.  He follows with a cardiologist, history of stents. He has type 1 diabetes and an insulin  pump.  He denies travel outside of the [place]  in the last 5 years. He has a history of alcohol use and smokes weed. 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  The history is provided by the patient.  Diarrhea Diarrhea characteristics: yellow. Duration:  12 months Timing:  Constant Progression:  Worsening Associated symptoms: chills   Associated symptoms: no abdominal pain, no URI and no vomiting   Risk factors: recent antibiotic use   Risk factors: no sick contacts and no travel to endemic areas        Prior to Admission medications   Medication Sig Start Date End Date Taking? Authorizing Provider  aspirin  EC 81 MG tablet Take 1 tablet (81 mg total) by mouth daily. Swallow whole. 08/26/20   Kate Lonni CROME, MD  JANUVIA 100 MG tablet Take 100 mg by mouth daily. 05/13/20   [provider]  JARDIANCE  25 MG TABS tablet Take 25 mg by mouth daily. 04/26/18   [provider]  pantoprazole  (PROTONIX ) 20 MG tablet Take 1 tablet (20 mg total) by mouth 2 (two) times daily for 10 days. 09/12/23 09/22/23  Mesner, Selinda, MD  rosuvastatin  (CRESTOR ) 5 MG tablet Take 1 tablet (5 mg total) by mouth daily. 08/26/20 08/21/21  Kate Lonni CROME, MD  sucralfate  (CARAFATE ) 1 GM/10ML suspension Take 10 mLs (1 g total) by mouth 4 (four) times daily -  with meals and at bedtime. 09/12/23   Mesner, Selinda, MD    Allergies: Atorvastatin , Glimepiride, Metformin and related, Other, Tramadol  hcl, Androgel [testosterone], Codeine, and Phenergan [promethazine hcl]    Review of Systems  Constitutional:  Positive for chills.  Gastrointestinal:  Positive for diarrhea. Negative for abdominal pain and vomiting.    Updated Vital Signs BP (!) 150/73 (BP Location: Right Arm)   Pulse 73   Temp 99.1 F (37.3 C) (Oral)   Resp 20   Ht 5' 11 (1.803 m)   Wt 79.8 kg   SpO2 98%   BMI 24.55 kg/m   Physical Exam Constitutional:      Appearance: Normal appearance.  HENT:     Head: Normocephalic and atraumatic.     Mouth/Throat:     Mouth: Mucous membranes are dry.  Eyes:  General: No scleral icterus.    Extraocular Movements: Extraocular movements intact.  Cardiovascular:     Pulses: Normal pulses.     Heart sounds: Normal heart sounds.  Pulmonary:     Effort: Pulmonary effort is normal.  Abdominal:     General: There is no distension.     Palpations: Abdomen is soft. There is no fluid wave, hepatomegaly, splenomegaly or mass.     Tenderness: There is no abdominal tenderness. There is no guarding or rebound.  Skin:    Coloration: Skin is not jaundiced.  Neurological:     Mental Status: He is alert.     (all labs ordered are listed, but only abnormal results are displayed) Labs Reviewed  COMPREHENSIVE METABOLIC PANEL WITH GFR - Abnormal; Notable for the following components:      Result Value    Sodium 132 (*)    CO2 20 (*)    Glucose, Bld 170 (*)    All other components within normal limits  CBC - Abnormal; Notable for the following components:   MCHC 36.1 (*)    All other components within normal limits  URINALYSIS, ROUTINE W REFLEX MICROSCOPIC - Abnormal; Notable for the following components:   Hgb urine dipstick SMALL (*)    Ketones, ur 15 (*)    All other components within normal limits  GASTROINTESTINAL PANEL BY PCR, STOOL (REPLACES STOOL CULTURE)  C DIFFICILE QUICK SCREEN W PCR REFLEX    LIPASE, BLOOD    EKG: None  Radiology: No results found.   Procedures   Medications Ordered in the ED  sodium chloride  0.9 % bolus 1,000 mL (has no administration in time range)  iohexol  (OMNIPAQUE ) 300 MG/ML solution 100 mL (100 mLs Intravenous Contrast Given 11/24/23 1946)                                    Medical Decision Making Amount and/or Complexity of Data Reviewed Labs: ordered. Radiology: ordered.  Risk OTC drugs. Prescription drug management.   This patient presents to the ED for concern of chronic diarrhea, this involves an extensive number of treatment options, and is a complaint that carries with it a high risk of complications and morbidity.  The differential diagnosis includes infectious, inflammatory, malignant. Parasite or bacterial (c. Diff). Inflammatory bowel syndrome, crohns disease, diverticulitis, pancreatitis, cirrhosis, cholecystitis  Patient given mylanta before leaving as requested for stomach upset. Lipase normal, pancreatitis less likely  White count normal making infectious or inflammatory etiology less likely AST/ALT normal making cirrhosis less likely  Alk phos and bilirubin normal making gall bladder pathology less likely  Ketones and small Hbg on urinalysis. Patient has not had an appetite, encouraged to increase dietary intake and follow up with a continuous care giver Stool sample results still pending. patient instructed to follow  up with gastroenterology to interpret the results and develop treatment plan   Co morbidities / Chronic conditions that complicate the patient evaluation  Alcohol use  Type 1 diabetes  Lab Tests:  I Ordered, and personally interpreted labs.  The pertinent results include:   CBC: normal WBC and RBC Lipase: normal Stool sample: pending UA: small amount of Hbg and 15 ketones   Imaging Studies ordered:  I ordered imaging studies including CT abdomen with contrast.   I independently visualized and interpreted imaging which showed no diverticulitis or inflammatory bowel disease or bowel obstruction.  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

## 2023-11-24 NOTE — Discharge Instructions (Addendum)
  It was a pleasure caring for you today in the emergency department.  We placed a gastroenterology referral with Dr. Kriss.   Please return to the ED if your condition rapidly worsens. Return to ED if you feel dizzy, have shortness of breathe, acute back pain, abdominal pain, or chest pain.   Your stool studies will result on mychart, please follow up with your pcp regarding these results  Remember to drink fluids and try to increase food intake.    Please return to the emergency department for any worsening or worrisome symptoms.

## 2023-11-24 NOTE — ED Notes (Signed)
 Patient transported to CT

## 2023-11-24 NOTE — ED Triage Notes (Signed)
 Reports diarrhea for a year, being treated for Ecoli x 2 rounds of abx and states it's getting worse. Endorses abd pain and 9lb weight loss in the last 2 weeks.

## 2023-11-25 LAB — C DIFFICILE QUICK SCREEN W PCR REFLEX
C Diff antigen: NEGATIVE
C Diff interpretation: NOT DETECTED
C Diff toxin: NEGATIVE

## 2023-11-25 LAB — GASTROINTESTINAL PANEL BY PCR, STOOL (REPLACES STOOL CULTURE)
